# Patient Record
Sex: Male | Born: 1955 | Marital: Married | Smoking: Current every day smoker
Health system: Southern US, Community
[De-identification: ages and names within clinical notes are randomized; demographics above are authoritative.]

## PROBLEM LIST (undated history)

## (undated) DIAGNOSIS — I10 Essential (primary) hypertension: Secondary | ICD-10-CM

## (undated) DIAGNOSIS — Z720 Tobacco use: Secondary | ICD-10-CM

---

## 2017-11-18 ENCOUNTER — Inpatient Hospital Stay (HOSPITAL_COMMUNITY)
Admission: AD | Admit: 2017-11-18 | Discharge: 2017-11-29 | DRG: 234 | Disposition: A | Discharge status: Home / Self Care | Payer: Self-pay | Source: Other Acute Inpatient Hospital | Attending: Surgery | Admitting: Surgery

## 2017-11-18 ENCOUNTER — Other Ambulatory Visit: Payer: Self-pay

## 2017-11-18 ENCOUNTER — Encounter (HOSPITAL_COMMUNITY): Payer: Self-pay | Admitting: Cardiology

## 2017-11-18 DIAGNOSIS — I214 Non-ST elevation (NSTEMI) myocardial infarction: Principal | ICD-10-CM | POA: Diagnosis present

## 2017-11-18 DIAGNOSIS — Z09 Encounter for follow-up examination after completed treatment for conditions other than malignant neoplasm: Secondary | ICD-10-CM

## 2017-11-18 DIAGNOSIS — I4891 Unspecified atrial fibrillation: Secondary | ICD-10-CM | POA: Diagnosis not present

## 2017-11-18 DIAGNOSIS — E8770 Fluid overload, unspecified: Secondary | ICD-10-CM | POA: Diagnosis not present

## 2017-11-18 DIAGNOSIS — T490X5A Adverse effect of local antifungal, anti-infective and anti-inflammatory drugs, initial encounter: Secondary | ICD-10-CM | POA: Diagnosis not present

## 2017-11-18 DIAGNOSIS — R062 Wheezing: Secondary | ICD-10-CM

## 2017-11-18 DIAGNOSIS — R21 Rash and other nonspecific skin eruption: Secondary | ICD-10-CM | POA: Diagnosis not present

## 2017-11-18 DIAGNOSIS — E663 Overweight: Secondary | ICD-10-CM | POA: Diagnosis present

## 2017-11-18 DIAGNOSIS — Z888 Allergy status to other drugs, medicaments and biological substances status: Secondary | ICD-10-CM

## 2017-11-18 DIAGNOSIS — T886XXA Anaphylactic reaction due to adverse effect of correct drug or medicament properly administered, initial encounter: Secondary | ICD-10-CM | POA: Diagnosis not present

## 2017-11-18 DIAGNOSIS — Z8249 Family history of ischemic heart disease and other diseases of the circulatory system: Secondary | ICD-10-CM

## 2017-11-18 DIAGNOSIS — D62 Acute posthemorrhagic anemia: Secondary | ICD-10-CM | POA: Diagnosis not present

## 2017-11-18 DIAGNOSIS — I77810 Thoracic aortic ectasia: Secondary | ICD-10-CM | POA: Diagnosis present

## 2017-11-18 DIAGNOSIS — I10 Essential (primary) hypertension: Secondary | ICD-10-CM | POA: Diagnosis present

## 2017-11-18 DIAGNOSIS — E119 Type 2 diabetes mellitus without complications: Secondary | ICD-10-CM | POA: Diagnosis present

## 2017-11-18 DIAGNOSIS — I25110 Atherosclerotic heart disease of native coronary artery with unstable angina pectoris: Secondary | ICD-10-CM | POA: Diagnosis present

## 2017-11-18 DIAGNOSIS — Z6828 Body mass index (BMI) 28.0-28.9, adult: Secondary | ICD-10-CM

## 2017-11-18 DIAGNOSIS — Z951 Presence of aortocoronary bypass graft: Secondary | ICD-10-CM

## 2017-11-18 DIAGNOSIS — E8881 Metabolic syndrome: Secondary | ICD-10-CM | POA: Diagnosis present

## 2017-11-18 DIAGNOSIS — Y838 Other surgical procedures as the cause of abnormal reaction of the patient, or of later complication, without mention of misadventure at the time of the procedure: Secondary | ICD-10-CM | POA: Diagnosis not present

## 2017-11-18 DIAGNOSIS — Z79899 Other long term (current) drug therapy: Secondary | ICD-10-CM

## 2017-11-18 DIAGNOSIS — F17210 Nicotine dependence, cigarettes, uncomplicated: Secondary | ICD-10-CM | POA: Diagnosis present

## 2017-11-18 DIAGNOSIS — I454 Nonspecific intraventricular block: Secondary | ICD-10-CM | POA: Diagnosis present

## 2017-11-18 DIAGNOSIS — I97190 Other postprocedural cardiac functional disturbances following cardiac surgery: Secondary | ICD-10-CM | POA: Diagnosis not present

## 2017-11-18 DIAGNOSIS — I959 Hypotension, unspecified: Secondary | ICD-10-CM | POA: Diagnosis not present

## 2017-11-18 DIAGNOSIS — I2510 Atherosclerotic heart disease of native coronary artery without angina pectoris: Secondary | ICD-10-CM

## 2017-11-18 HISTORY — DX: Essential (primary) hypertension: I10

## 2017-11-18 HISTORY — DX: Tobacco use: Z72.0

## 2017-11-18 LAB — HEPARIN LEVEL (UNFRACTIONATED): Heparin Unfractionated: 0.18 IU/mL — ABNORMAL LOW (ref 0.30–0.70)

## 2017-11-18 LAB — TROPONIN I: Troponin I: 0.03 ng/mL (ref <0.03)

## 2017-11-18 LAB — HEMOGLOBIN A1C
HEMOGLOBIN A1C: 5.9 % — AB (ref 4.8–5.6)
Mean Plasma Glucose: 122.63 mg/dL

## 2017-11-18 MED ORDER — SODIUM CHLORIDE 0.9% FLUSH: 3.0000 mL | INTRAVENOUS | Status: DC | PRN

## 2017-11-18 MED ORDER — NITROGLYCERIN 0.4 MG SL SUBL
0.4000 mg | SUBLINGUAL_TABLET | SUBLINGUAL | Status: DC | PRN
  Administered 2017-11-21 – 2017-11-22 (×4): 0.4 mg via SUBLINGUAL
  Filled 2017-11-18 (×4): qty 1

## 2017-11-18 MED ORDER — ONDANSETRON HCL 4 MG/2ML IJ SOLN: 4.0000 mg | Freq: Four times a day (QID) | INTRAMUSCULAR | Status: DC | PRN

## 2017-11-18 MED ORDER — ASPIRIN 81 MG PO CHEW
81.0000 mg | CHEWABLE_TABLET | ORAL | Status: AC
  Administered 2017-11-19: 81 mg via ORAL
  Filled 2017-11-18: qty 1

## 2017-11-18 MED ORDER — ACETAMINOPHEN 325 MG PO TABS: 650.0000 mg | ORAL_TABLET | ORAL | Status: DC | PRN

## 2017-11-18 MED ORDER — SODIUM CHLORIDE 0.9% FLUSH
3.0000 mL | Freq: Two times a day (BID) | INTRAVENOUS | Status: DC
  Administered 2017-11-18 – 2017-11-19 (×2): 3 mL via INTRAVENOUS

## 2017-11-18 MED ORDER — HEPARIN BOLUS VIA INFUSION
2600.0000 [IU] | Freq: Once | INTRAVENOUS | Status: AC
  Administered 2017-11-18: 2600 [IU] via INTRAVENOUS
  Filled 2017-11-18: qty 2600

## 2017-11-18 MED ORDER — HEPARIN (PORCINE) IN NACL 100-0.45 UNIT/ML-% IJ SOLN
1350.0000 [IU]/h | INTRAMUSCULAR | Status: DC
  Administered 2017-11-18: 1350 [IU]/h via INTRAVENOUS
  Filled 2017-11-18: qty 250

## 2017-11-18 MED ORDER — ATORVASTATIN CALCIUM 80 MG PO TABS: 80.0000 mg | ORAL_TABLET | Freq: Every day | ORAL | Status: DC

## 2017-11-18 MED ORDER — ASPIRIN EC 81 MG PO TBEC
81.0000 mg | DELAYED_RELEASE_TABLET | Freq: Every day | ORAL | Status: DC
  Administered 2017-11-19: 81 mg via ORAL
  Filled 2017-11-18: qty 1

## 2017-11-18 MED ORDER — SODIUM CHLORIDE 0.9 % WEIGHT BASED INFUSION
1.0000 mL/kg/h | INTRAVENOUS | Status: DC
  Administered 2017-11-19: 1 mL/kg/h via INTRAVENOUS

## 2017-11-18 MED ORDER — SODIUM CHLORIDE 0.9 % IV SOLN: 250.0000 mL | INTRAVENOUS | Status: DC | PRN

## 2017-11-18 MED ORDER — SODIUM CHLORIDE 0.9 % WEIGHT BASED INFUSION
3.0000 mL/kg/h | INTRAVENOUS | Status: DC
  Administered 2017-11-19: 3 mL/kg/h via INTRAVENOUS

## 2017-11-18 MED ORDER — LISINOPRIL 20 MG PO TABS
20.0000 mg | ORAL_TABLET | Freq: Every day | ORAL | Status: DC
  Administered 2017-11-19 – 2017-11-22 (×4): 20 mg via ORAL
  Filled 2017-11-18 (×4): qty 1

## 2017-11-18 NOTE — Progress Notes (Addendum)
ANTICOAGULATION CONSULT NOTE - Initial Consult  Pharmacy Consult for heparin Indication: NSTEMI  Allergies  Allergen Reactions  . Chantix [Varenicline] Other (See Comments)    Nightmares    Patient Measurements: Height: 5\' 8"  (172.7 cm) Weight: 198 lb 6.6 oz (90 kg)(from OSH) IBW/kg (Calculated) : 68.4 Heparin Dosing Weight: 87 kg  Vital Signs: Temp: 98.1 F (36.7 C) (12/26 1700) Temp Source: Oral (12/26 1700) BP: 161/91 (12/26 1700) Pulse Rate: 58 (12/26 1700)  Labs: Recent Labs    11/18/17 1814  TROPONINI 0.03*    CrCl cannot be calculated (No order found.).   Medical History: Past Medical History:  Diagnosis Date  . HTN (hypertension)   . Tobacco use     Medications:  Medications Prior to Admission  Medication Sig Dispense Refill Last Dose  . Aspirin-Salicylamide-Caffeine (BC HEADACHE POWDER PO) Take 1 packet by mouth daily.   11/18/2017 at Unknown time  . lisinopril (PRINIVIL,ZESTRIL) 20 MG tablet Take 20 mg by mouth 2 (two) times daily.   11/18/2017 at Unknown time    Assessment: 61 y/o male wo no prior hx CAD [facility] work-up of NSTEMI. Plan is for cath tomorrow. He was started on IV heparin at OSH: 5000 unit bolus given at 13:33 and started on 12 units/kg/hr (1080 units/hr).   Labs from today: SCr 0.9, H/H 18.8/53.5, pltc 224  No bleeding noted.  Goal of Therapy:  Heparin level 0.3-0.7 units/ml Monitor platelets by anticoagulation protocol: Yes   Plan:  - Continue heparin drip at 1080 units/hr - Heparin level now - Daily heparin level and CBC - Monitor for s/sx of bleeding   Loura BackJennifer Sibley, PharmD, BCPS Clinical Pharmacist Phone for today [phone]- [phone] Main pharmacy - [phone] 11/18/2017 7:51 PM   Addendum: Heparin level is 0.18 and subtherapeutic. No bleeding or infusion problems per RN.  Heparin 2600 units IV bolus then increase infusion to 1350 units/hr 6 hr heparin level  Loura BackJennifer Novato, PharmD,  BCPS Clinical Pharmacist 11/18/2017 10:13 PM

## 2017-11-18 NOTE — H&P (Signed)
Cardiology Admission History and Physical:   Patient ID: Dale [name]; MRN: [id number]; DOB: 03/13/1956   Admission date: 11/18/2017  Primary Care Provider: Manley MasonVaught, Carolyn R, MD Primary CConchita Parisardiologist: New (Dr. Allyson SabalBerry)   Chief Complaint:  Chest pain   Patient Profile:   Dale [name] is a 61 y.o. male with h/o HTN and 45 year h/o tobacco use, transferred from OSH for unstable stable angina and has ruled in for NSTEMI.   History of Present Illness:   Dale [name] 61 y/o male smoker x 45 years. H/o HTN, on lisinopril 20 mg. Family h/o heart disease. Mother had PPM. He denies prior h/o HLD and DM. Married x 38 years, 1 daughter and 1 granddaughter.   He has had 1 week h/o intermitted SSCP, described as crushing pain with associated dyspnea and diaphoresis. Worse today prompting visit to PCP. [facility]/u. EKG there showed lateral ST depressions and troponin abnormal at 0.50. He was started on IV heparin + nitro patch and [facility]. He is currently CP free.    Past Medical History:  Diagnosis Date  . HTN (hypertension)   . Tobacco use     Medications Prior to Admission: Prior to Admission medications   Not on File   Lisinopril 20 mg daily   Allergies:    Allergies  Allergen Reactions  . Chantix [Varenicline] Other (See Comments)    Nightmares    Social History:   Social History   Socioeconomic History  . Marital status: Married    Spouse name: Not on file  . Number of children: Not on file  . Years of education: Not on file  . Highest education level: Not on file  Social Needs  . Financial resource strain: Not on file  . Food insecurity - worry: Not on file  . Food insecurity - inability: Not on file  . Transportation needs - medical: Not on file  . Transportation needs - non-medical: Not on file  Occupational History  . Not on file  Tobacco Use  . Smoking status: Current Every Day Smoker    Years:  45.00    Types: Cigarettes  . Smokeless tobacco: Never Used  Substance and Sexual Activity  . Alcohol use: Not on file  . Drug use: Not on file  . Sexual activity: Not on file  Other Topics Concern  . Not on file  Social History Narrative  . Not on file    Family History:   The patient's family history includes Bradycardia in his mother.    ROS:  Please see the history of present illness.  All other ROS reviewed and negative.     Physical Exam/Data:   Vitals:   11/18/17 1700  BP: (!) 161/91  Pulse: (!) 58  Temp: 98.1 F (36.7 C)  TempSrc: Oral  SpO2: 99%   No intake or output data in the 24 hours ending 11/18/17 1803 There were no vitals filed for this visit. There is no height or weight on file to calculate BMI.  General:  Well nourished, well developed, in no acute distress, obesity  HEENT: normal Lymph: no adenopathy Neck: no JVD Endocrine:  No thryomegaly Vascular: No carotid bruits; FA pulses 2+ bilaterally without bruits  Cardiac:  normal S1, S2; RRR; no murmur  Lungs:  clear to auscultation bilaterally, no wheezing, rhonchi or rales  Abd: soft, nontender, no hepatomegaly  Ext: no edema Musculoskeletal:  No deformities, BUE and BLE strength  normal and equal Skin: warm and dry  Neuro:  CNs 2-12 intact, no focal abnormalities noted Psych:  Normal affect    EKG:  The ECG was personally reviewed and demonstrates lateral ST depressions, no prior EKgs for comparision  Relevant CV Studies: Pending   Laboratory Data:  ChemistryNo results for input(s): NA, K, CL, CO2, GLUCOSE, BUN, CREATININE, CALCIUM, GFRNONAA, GFRAA, ANIONGAP in the last 168 hours.  No results for input(s): PROT, ALBUMIN, AST, ALT, ALKPHOS, BILITOT in the last 168 hours. HematologyNo results for input(s): WBC, RBC, HGB, HCT, MCV, MCH, MCHC, RDW, PLT in the last 168 hours. Cardiac EnzymesNo results for input(s): TROPONINI in the last 168 hours. No results for input(s): TROPIPOC in the last 168  hours.  BNPNo results for input(s): BNP, PROBNP in the last 168 hours.  DDimer No results for input(s): DDIMER in the last 168 hours.  Radiology/Studies:  No results found.  Assessment and Plan:   1. NSTEMI: symptoms and enzyme level is c/w NSTEMI. He is currently CP free. Will continue IV heparin and nitro patch. NPO at midnight with plans for [facility] tomorrow. Will add ASA and statin. Avoid BB for now given borderline bradycardia. 2D echo to assess LVEF. Fasting lipid panel in the am. Smoking cessation strongly advised.    Severity of Illness: The appropriate patient status for this patient is INPATIENT. Inpatient status is judged to be reasonable and necessary in order to provide the required intensity of service to ensure the patient's safety. The patient's presenting symptoms, physical exam findings, and initial radiographic and laboratory data in the context of their chronic comorbidities is felt to place them at high risk for further clinical deterioration. Furthermore, it is not anticipated that the patient will be medically stable for discharge from the hospital within 2 midnights of admission. The following factors support the patient status of inpatient.   " The patient's presenting symptoms include unstable angina. " The worrisome physical exam findings include abnormal EKG and HTN. " The initial radiographic and laboratory data are worrisome because of abnormal EKG and + troponin. " The chronic co-morbidities include HTN and tobacco use.   * I certify that at the point of admission it is my clinical judgment that the patient will require inpatient hospital care spanning beyond 2 midnights from the point of admission due to high intensity of service, high risk for further deterioration and high frequency of surveillance required.*    For questions or updates, please contact [facility] Please consult [email or web address] for contact info under Cardiology/STEMI.    Signed, Robbie Lis, PA-C  11/18/2017 6:03 PM   Agree with note written by Boyce Medici  [facility]  Dale [name] was transferred from [facility] for unstable angina/non-STEMI.  He has no prior cardiac history.  He is a 61 year old moderately overweight married Caucasian male with a history of hypertension and 45 pack years of tobacco abuse currently smoking 1 pack/day.  There is no family history of heart disease.  He is never had a heart attack or stroke.  He works as a Location manager.  He has had chest pain off and on for the last 2 weeks worse last night and today.  He saw his primary care physician, Lonie Peak PA-C recommended that he go to [facility].  He was treated with IV heparin.  His enzymes were low and his EKG showed nonspecific changes with some flattening lateral ST segment depression.  He was treated with topical nitrates and transported here where  he is currently pain-free.  His exam is benign.  We will continue his IV heparin, cycle his enzymes and place him on aspirin.  His heart rate is already in the 60s and therefore a beta-blocker will not be started tonight.  We will plan on performing diagnostic coronary angiography tomorrow. The patient understands that risks included but are not limited to stroke (1 in 1000), death (1 in 1000), kidney failure [usually temporary] (1 in 500), bleeding (1 in 200), allergic reaction [possibly serious] (1 in 200). The patient understands and agrees to proceed   Nanetta BattyJonathan Pansey Pinheiro 11/18/2017 6:05 PM

## 2017-11-19 ENCOUNTER — Inpatient Hospital Stay (HOSPITAL_COMMUNITY): Payer: Self-pay

## 2017-11-19 ENCOUNTER — Encounter (HOSPITAL_COMMUNITY)
Admission: AD | Disposition: A | Discharge status: Home / Self Care | Payer: Self-pay | Source: Other Acute Inpatient Hospital | Attending: Surgery

## 2017-11-19 ENCOUNTER — Other Ambulatory Visit: Payer: Self-pay

## 2017-11-19 DIAGNOSIS — I200 Unstable angina: Secondary | ICD-10-CM

## 2017-11-19 DIAGNOSIS — I214 Non-ST elevation (NSTEMI) myocardial infarction: Secondary | ICD-10-CM

## 2017-11-19 DIAGNOSIS — R079 Chest pain, unspecified: Secondary | ICD-10-CM

## 2017-11-19 HISTORY — PX: LEFT HEART CATH AND CORONARY ANGIOGRAPHY: CATH118249

## 2017-11-19 LAB — TROPONIN I: Troponin I: <0.03 ng/mL (ref <0.03)

## 2017-11-19 LAB — CBC
HCT: 47.9 % (ref 39.0–52.0)
Hemoglobin: 16.4 g/dL (ref 13.0–17.0)
MCH: 32.8 pg (ref 26.0–34.0)
MCHC: 34.2 g/dL (ref 30.0–36.0)
MCV: 95.8 fL (ref 78.0–100.0)
PLATELETS: 196 10*3/uL (ref 150–400)
RBC: 5 MIL/uL (ref 4.22–5.81)
RDW: 13.1 % (ref 11.5–15.5)
WBC: 6.5 10*3/uL (ref 4.0–10.5)

## 2017-11-19 LAB — PROTIME-INR
INR: 1.01
PROTHROMBIN TIME: 13.2 s (ref 11.4–15.2)

## 2017-11-19 LAB — LIPID PANEL
CHOL/HDL RATIO: 3.4 ratio
CHOLESTEROL: 179 mg/dL (ref 0–200)
HDL: 52 mg/dL (ref >40)
LDL Cholesterol: 104 mg/dL — ABNORMAL HIGH (ref 0–99)
Triglycerides: 117 mg/dL (ref <150)
VLDL: 23 mg/dL (ref 0–40)

## 2017-11-19 LAB — ECHOCARDIOGRAM COMPLETE
Height: 68 in
Weight: 3174.62 oz

## 2017-11-19 LAB — BASIC METABOLIC PANEL
ANION GAP: 8 (ref 5–15)
BUN: 12 mg/dL (ref 6–20)
CHLORIDE: 102 mmol/L (ref 101–111)
CO2: 23 mmol/L (ref 22–32)
Calcium: 8.4 mg/dL — ABNORMAL LOW (ref 8.9–10.3)
Creatinine, Ser: 0.89 mg/dL (ref 0.61–1.24)
Glucose, Bld: 92 mg/dL (ref 65–99)
POTASSIUM: 3.8 mmol/L (ref 3.5–5.1)
Sodium: 133 mmol/L — ABNORMAL LOW (ref 135–145)

## 2017-11-19 LAB — HEPARIN LEVEL (UNFRACTIONATED): HEPARIN UNFRACTIONATED: 0.55 [IU]/mL (ref 0.30–0.70)

## 2017-11-19 LAB — HIV ANTIBODY (ROUTINE TESTING W REFLEX): HIV SCREEN 4TH GENERATION: NONREACTIVE

## 2017-11-19 SURGERY — LEFT HEART CATH AND CORONARY ANGIOGRAPHY
Anesthesia: LOCAL

## 2017-11-19 MED ORDER — ATORVASTATIN CALCIUM 80 MG PO TABS
80.0000 mg | ORAL_TABLET | Freq: Every day | ORAL | Status: DC
  Administered 2017-11-19 – 2017-11-28 (×9): 80 mg via ORAL
  Filled 2017-11-19 (×9): qty 1

## 2017-11-19 MED ORDER — HEPARIN (PORCINE) IN NACL 2-0.9 UNIT/ML-% IJ SOLN
INTRAMUSCULAR | Status: AC
  Filled 2017-11-19: qty 1000

## 2017-11-19 MED ORDER — LIDOCAINE HCL (PF) 1 % IJ SOLN
INTRAMUSCULAR | Status: DC | PRN
  Administered 2017-11-19: 2 mL

## 2017-11-19 MED ORDER — SODIUM CHLORIDE 0.9% FLUSH
3.0000 mL | Freq: Two times a day (BID) | INTRAVENOUS | Status: DC
  Administered 2017-11-20 – 2017-11-23 (×4): 3 mL via INTRAVENOUS

## 2017-11-19 MED ORDER — HEPARIN (PORCINE) IN NACL 2-0.9 UNIT/ML-% IJ SOLN
INTRAMUSCULAR | Status: AC | PRN
  Administered 2017-11-19: 1000 mL

## 2017-11-19 MED ORDER — NITROGLYCERIN 1 MG/10 ML FOR IR/CATH LAB
INTRA_ARTERIAL | Status: AC
  Filled 2017-11-19: qty 10

## 2017-11-19 MED ORDER — ASPIRIN 81 MG PO CHEW
81.0000 mg | CHEWABLE_TABLET | Freq: Every day | ORAL | Status: DC
  Administered 2017-11-20 – 2017-11-22 (×3): 81 mg via ORAL
  Filled 2017-11-19 (×3): qty 1

## 2017-11-19 MED ORDER — FENTANYL CITRATE (PF) 100 MCG/2ML IJ SOLN
INTRAMUSCULAR | Status: DC | PRN
  Administered 2017-11-19: 25 ug via INTRAVENOUS

## 2017-11-19 MED ORDER — ONDANSETRON HCL 4 MG/2ML IJ SOLN: 4.0000 mg | Freq: Four times a day (QID) | INTRAMUSCULAR | Status: DC | PRN

## 2017-11-19 MED ORDER — HEPARIN SODIUM (PORCINE) 1000 UNIT/ML IJ SOLN
INTRAMUSCULAR | Status: DC | PRN
  Administered 2017-11-19: 4500 [IU] via INTRAVENOUS

## 2017-11-19 MED ORDER — IOPAMIDOL (ISOVUE-370) INJECTION 76%
INTRAVENOUS | Status: DC | PRN
  Administered 2017-11-19: 85 mL via INTRA_ARTERIAL

## 2017-11-19 MED ORDER — HYDRALAZINE HCL 20 MG/ML IJ SOLN
10.0000 mg | Freq: Three times a day (TID) | INTRAMUSCULAR | Status: DC | PRN
  Administered 2017-11-19 – 2017-11-20 (×2): 10 mg via INTRAVENOUS
  Filled 2017-11-19 (×2): qty 1

## 2017-11-19 MED ORDER — NICOTINE 21 MG/24HR TD PT24
21.0000 mg | MEDICATED_PATCH | Freq: Every day | TRANSDERMAL | Status: DC
  Administered 2017-11-19 – 2017-11-22 (×4): 21 mg via TRANSDERMAL
  Filled 2017-11-19 (×4): qty 1

## 2017-11-19 MED ORDER — VERAPAMIL HCL 2.5 MG/ML IV SOLN
INTRAVENOUS | Status: AC
  Filled 2017-11-19: qty 2

## 2017-11-19 MED ORDER — SODIUM CHLORIDE 0.9 % IV SOLN: 250.0000 mL | INTRAVENOUS | Status: DC | PRN

## 2017-11-19 MED ORDER — HEPARIN (PORCINE) IN NACL 100-0.45 UNIT/ML-% IJ SOLN
1450.0000 [IU]/h | INTRAMUSCULAR | Status: DC
  Administered 2017-11-20 – 2017-11-22 (×5): 1450 [IU]/h via INTRAVENOUS
  Filled 2017-11-19 (×6): qty 250

## 2017-11-19 MED ORDER — AMLODIPINE BESYLATE 5 MG PO TABS: 5.0000 mg | ORAL_TABLET | Freq: Every day | ORAL | Status: DC

## 2017-11-19 MED ORDER — SODIUM CHLORIDE 0.9% FLUSH: 3.0000 mL | INTRAVENOUS | Status: DC | PRN

## 2017-11-19 MED ORDER — MIDAZOLAM HCL 2 MG/2ML IJ SOLN
INTRAMUSCULAR | Status: DC | PRN
  Administered 2017-11-19: 1 mg via INTRAVENOUS

## 2017-11-19 MED ORDER — HEPARIN (PORCINE) IN NACL 100-0.45 UNIT/ML-% IJ SOLN
12.00 | INTRAMUSCULAR | Status: DC
Start: ? — End: 2017-11-19

## 2017-11-19 MED ORDER — FENTANYL CITRATE (PF) 100 MCG/2ML IJ SOLN
INTRAMUSCULAR | Status: AC
  Filled 2017-11-19: qty 2

## 2017-11-19 MED ORDER — MIDAZOLAM HCL 2 MG/2ML IJ SOLN
INTRAMUSCULAR | Status: AC
  Filled 2017-11-19: qty 2

## 2017-11-19 MED ORDER — HEPARIN SODIUM (PORCINE) 1000 UNIT/ML IJ SOLN
2000.00 | INTRAMUSCULAR | Status: DC
Start: ? — End: 2017-11-19

## 2017-11-19 MED ORDER — AMLODIPINE BESYLATE 5 MG PO TABS
5.0000 mg | ORAL_TABLET | Freq: Every day | ORAL | Status: DC
  Administered 2017-11-19 – 2017-11-22 (×4): 5 mg via ORAL
  Filled 2017-11-19 (×4): qty 1

## 2017-11-19 MED ORDER — SODIUM CHLORIDE 0.9 % IV SOLN: INTRAVENOUS | Status: AC

## 2017-11-19 MED ORDER — MORPHINE SULFATE (PF) 2 MG/ML IV SOLN: 2.0000 mg | INTRAVENOUS | Status: DC | PRN

## 2017-11-19 MED ORDER — ACETAMINOPHEN 325 MG PO TABS
650.0000 mg | ORAL_TABLET | ORAL | Status: DC | PRN
  Administered 2017-11-22: 650 mg via ORAL
  Filled 2017-11-19: qty 2

## 2017-11-19 MED ORDER — VERAPAMIL HCL 2.5 MG/ML IV SOLN
INTRA_ARTERIAL | Status: DC | PRN
  Administered 2017-11-19: 16:00:00 via INTRA_ARTERIAL

## 2017-11-19 MED ORDER — HEPARIN SODIUM (PORCINE) 1000 UNIT/ML IJ SOLN
INTRAMUSCULAR | Status: AC
  Filled 2017-11-19: qty 1

## 2017-11-19 MED ORDER — LIDOCAINE HCL (PF) 1 % IJ SOLN
INTRAMUSCULAR | Status: AC
  Filled 2017-11-19: qty 30

## 2017-11-19 MED ORDER — ZOLPIDEM TARTRATE 5 MG PO TABS
5.0000 mg | ORAL_TABLET | Freq: Once | ORAL | Status: AC
  Administered 2017-11-19: 5 mg via ORAL
  Filled 2017-11-19: qty 1

## 2017-11-19 MED ORDER — HEPARIN (PORCINE) IN NACL 2-0.9 UNIT/ML-% IJ SOLN: INTRAMUSCULAR | Status: DC | PRN

## 2017-11-19 SURGICAL SUPPLY — 12 items

## 2017-11-19 NOTE — Progress Notes (Signed)
ANTICOAGULATION CONSULT NOTE - Follow Up Consult  Pharmacy Consult for heparin Indication: NSTEMI  Allergies  Allergen Reactions  . Chantix [Varenicline] Other (See Comments)    Nightmares    Patient Measurements: Height: 5\' 8"  (172.7 cm) Weight: 198 lb 6.6 oz (90 kg)(from OSH) IBW/kg (Calculated) : 68.4 Heparin Dosing Weight: 87 kg  Vital Signs: Temp: 97.3 F (36.3 C) (12/27 1342) Temp Source: Oral (12/27 1342) BP: 173/111 (12/27 1636) Pulse Rate: 64 (12/27 1636)  Labs: Recent Labs    11/18/17 1814 11/18/17 2039 11/19/17 0035 11/19/17 0452  HGB  --   --   --  16.4  HCT  --   --   --  47.9  PLT  --   --   --  196  LABPROT  --   --   --  13.2  INR  --   --   --  1.01  HEPARINUNFRC  --  0.18*  --  0.55  CREATININE  --   --   --  0.89  TROPONINI 0.03*  --  <0.03 <0.03    Estimated Creatinine Clearance: 96.1 mL/min (by C-G formula based on SCr of 0.89 mg/dL).  Assessment: 61 y/o male [facility], s/p cardiac cath  Sheath removal 1645 - to resume hep w/o bolus 2 hrs after  Goal of Therapy:  Heparin level 0.3-0.7 units/ml Monitor platelets by anticoagulation protocol: Yes   Plan:  Resume heparin 1350 units/hr @ 1845 0100 HL  Isaac BlissMichael April Carlyon, PharmD, BCPS, BCCCP Clinical Pharmacist Clinical phone for 11/19/2017 from 1430 [phone]: [phone] If after 2300, please call main pharmacy at: [phone] 11/19/2017 4:50 PM

## 2017-11-19 NOTE — Progress Notes (Signed)
Paged Cardiology about elevated blood pressures post cath. New orders placed and patient received.   Sheppard Evensina Takyra Cantrall RN

## 2017-11-19 NOTE — H&P (View-Only) (Signed)
Progress Note  Patient Name: Dale ParisBilly Kooyman Date of Encounter: 11/19/2017  Primary Cardiologist: Allyson SabalBerry   Subjective   No chest pain.   Inpatient Medications    Scheduled Meds: . aspirin EC  81 mg Oral Daily  . atorvastatin  80 mg Oral q1800  . lisinopril  20 mg Oral Daily  . sodium chloride flush  3 mL Intravenous Q12H   Continuous Infusions: . sodium chloride    . sodium chloride 1 mL/kg/hr (11/19/17 0754)  . heparin 1,350 Units/hr (11/18/17 2242)   PRN Meds: sodium chloride, acetaminophen, nitroGLYCERIN, ondansetron (ZOFRAN) IV, sodium chloride flush   Vital Signs    Vitals:   11/18/17 1700 11/18/17 1900 11/18/17 2300  BP: (!) 161/91  (!) 144/93  Pulse: (!) 58  61  Temp: 98.1 F (36.7 C)  (!) 97.5 F (36.4 C)  TempSrc: Oral  Oral  SpO2: 99%  100%  Weight:  198 lb 6.6 oz (90 kg)   Height:  5\' 8"  (1.727 m)     Intake/Output Summary (Last 24 hours) at 11/19/2017 16100821 Last data filed at 11/19/2017 0754 Gross per 24 hour  Intake 314.55 ml  Output 520 ml  Net -205.45 ml   Filed Weights   11/18/17 1900  Weight: 198 lb 6.6 oz (90 kg)    Telemetry    SB - Personally Reviewed  ECG    SR with LVH - Personally Reviewed  Physical Exam   General: Well developed, well nourished, male appearing in no acute distress. Head: Normocephalic, atraumatic.  Neck: Supple without bruits, JVD. Lungs:  Resp regular and unlabored, CTA. Heart: RRR, S1, S2, no S3, S4, or murmur; no rub. Abdomen: Soft, non-tender, non-distended with normoactive bowel sounds.  Extremities: No clubbing, cyanosis, edema. Distal pedal pulses are 2+ bilaterally. Neuro: Alert and oriented X 3. Moves all extremities spontaneously. Psych: Normal affect.  Labs    Chemistry Recent Labs  Lab 11/19/17 0452  NA 133*  K 3.8  CL 102  CO2 23  GLUCOSE 92  BUN 12  CREATININE 0.89  CALCIUM 8.4*  GFRNONAA >60  GFRAA >60  ANIONGAP 8     Hematology Recent Labs  Lab 11/19/17 0452  WBC  6.5  RBC 5.00  HGB 16.4  HCT 47.9  MCV 95.8  MCH 32.8  MCHC 34.2  RDW 13.1  PLT 196    Cardiac Enzymes Recent Labs  Lab 11/18/17 1814 11/19/17 0035 11/19/17 0452  TROPONINI 0.03* <0.03 <0.03   No results for input(s): TROPIPOC in the last 168 hours.   BNPNo results for input(s): BNP, PROBNP in the last 168 hours.   DDimer No results for input(s): DDIMER in the last 168 hours.    Radiology    No results found.  Cardiac Studies   N/a   Patient Profile     61 y.o. male with h/o HTN and 45 year h/o tobacco use, transferred from OSH for unstable stable angina and has ruled in for NSTEMI.   Assessment & Plan    1. NSTEMI: Trop peaked at 0.50. No chest pain overnight. Remains on IV heparin. Planned for cardiac cath this morning. LDL 104. -- on ASA, statin -- echo pending  2. Tobacco use: cessation advised.   3. HTN: blood pressures are borderline, may need further adjustment post cath.   Signed, Laverda PageLindsay Roberts, NP  11/19/2017, 8:21 AM  Pager # [phone]   For questions or updates, please contact [facility] Please consult [email or web address] for contact info under  Cardiology/STEMI.   Agree with note by Laverda PageLindsay Roberts NP-C  No recurrent chest pain.  Troponins have remained low.  He is on IV heparin and topical nitrates.  His exam is benign.  He is scheduled for cardiac catheterization afternoon.  He has eaten a clear liquid breakfast.  Runell GessJonathan J. Lilliemae Fruge, M.D., FACP, [facility] Center[facility]ACC, Kathryne ErikssonFAHA, FSCAI [facility] [address] [place]  [address]  [phone] 11/19/2017 9:17 AM

## 2017-11-19 NOTE — Interval H&P Note (Signed)
Cath Lab Visit (complete for each Cath Lab visit)  Clinical Evaluation Leading to the Procedure:   ACS: Yes.    Non-ACS:    Anginal Classification: CCS III  Anti-ischemic medical therapy: No Therapy  Non-Invasive Test Results: No non-invasive testing performed  Prior CABG: No previous CABG      History and Physical Interval Note:  11/19/2017 3:56 PM  Dale [name]  has presented today for surgery, with the diagnosis of Nstemi  The various methods of treatment have been discussed with the patient and family. After consideration of risks, benefits and other options for treatment, the patient has consented to  Procedure(s): LEFT HEART CATH AND CORONARY ANGIOGRAPHY (N/A) as a surgical intervention .  The patient's history has been reviewed, patient examined, no change in status, stable for surgery.  I have reviewed the patient's chart and labs.  Questions were answered to the patient's satisfaction.     Nanetta BattyJonathan Jessee Mezera

## 2017-11-19 NOTE — Progress Notes (Signed)
 Progress Note  Patient Name: Dale [name] Date of Encounter: 11/19/2017  Primary Cardiologist: Diante Barley   Subjective   No chest pain.   Inpatient Medications    Scheduled Meds: . aspirin EC  81 mg Oral Daily  . atorvastatin  80 mg Oral q1800  . lisinopril  20 mg Oral Daily  . sodium chloride flush  3 mL Intravenous Q12H   Continuous Infusions: . sodium chloride    . sodium chloride 1 mL/kg/hr (11/19/17 0754)  . heparin 1,350 Units/hr (11/18/17 2242)   PRN Meds: sodium chloride, acetaminophen, nitroGLYCERIN, ondansetron (ZOFRAN) IV, sodium chloride flush   Vital Signs    Vitals:   11/18/17 1700 11/18/17 1900 11/18/17 2300  BP: (!) 161/91  (!) 144/93  Pulse: (!) 58  61  Temp: 98.1 F (36.7 C)  (!) 97.5 F (36.4 C)  TempSrc: Oral  Oral  SpO2: 99%  100%  Weight:  198 lb 6.6 oz (90 kg)   Height:  5' 8" (1.727 m)     Intake/Output Summary (Last 24 hours) at 11/19/2017 0821 Last data filed at 11/19/2017 0754 Gross per 24 hour  Intake 314.55 ml  Output 520 ml  Net -205.45 ml   Filed Weights   11/18/17 1900  Weight: 198 lb 6.6 oz (90 kg)    Telemetry    SB - Personally Reviewed  ECG    SR with LVH - Personally Reviewed  Physical Exam   General: Well developed, well nourished, male appearing in no acute distress. Head: Normocephalic, atraumatic.  Neck: Supple without bruits, JVD. Lungs:  Resp regular and unlabored, CTA. Heart: RRR, S1, S2, no S3, S4, or murmur; no rub. Abdomen: Soft, non-tender, non-distended with normoactive bowel sounds.  Extremities: No clubbing, cyanosis, edema. Distal pedal pulses are 2+ bilaterally. Neuro: Alert and oriented X 3. Moves all extremities spontaneously. Psych: Normal affect.  Labs    Chemistry Recent Labs  Lab 11/19/17 0452  NA 133*  K 3.8  CL 102  CO2 23  GLUCOSE 92  BUN 12  CREATININE 0.89  CALCIUM 8.4*  GFRNONAA >60  GFRAA >60  ANIONGAP 8     Hematology Recent Labs  Lab 11/19/17 0452  WBC  6.5  RBC 5.00  HGB 16.4  HCT 47.9  MCV 95.8  MCH 32.8  MCHC 34.2  RDW 13.1  PLT 196    Cardiac Enzymes Recent Labs  Lab 11/18/17 1814 11/19/17 0035 11/19/17 0452  TROPONINI 0.03* <0.03 <0.03   No results for input(s): TROPIPOC in the last 168 hours.   BNPNo results for input(s): BNP, PROBNP in the last 168 hours.   DDimer No results for input(s): DDIMER in the last 168 hours.    Radiology    No results found.  Cardiac Studies   N/a   Patient Profile     61 y.o. male with h/o HTN and 45 year h/o tobacco use, transferred from OSH for unstable stable angina and has ruled in for NSTEMI.   Assessment & Plan    1. NSTEMI: Trop peaked at 0.50. No chest pain overnight. Remains on IV heparin. Planned for cardiac cath this morning. LDL 104. -- on ASA, statin -- echo pending  2. Tobacco use: cessation advised.   3. HTN: blood pressures are borderline, may need further adjustment post cath.   Signed, Lindsay Roberts, NP  11/19/2017, 8:21 AM  Pager # [phone]   For questions or updates, please contact [facility] Please consult [email or web address] for contact info under   Cardiology/STEMI.   Agree with note by Laverda PageLindsay Roberts NP-C  No recurrent chest pain.  Troponins have remained low.  He is on IV heparin and topical nitrates.  His exam is benign.  He is scheduled for cardiac catheterization afternoon.  He has eaten a clear liquid breakfast.  Runell GessJonathan J. Korver Graybeal, M.D., FACP, [facility] CenterFACC, Kathryne ErikssonFAHA, FSCAI [facility] [address] [place], [place]  [address]  [phone] 11/19/2017 9:17 AM

## 2017-11-19 NOTE — Progress Notes (Signed)
ANTICOAGULATION CONSULT NOTE - Follow Up Consult  Pharmacy Consult for heparin Indication: NSTEMI  Allergies  Allergen Reactions  . Chantix [Varenicline] Other (See Comments)    Nightmares    Patient Measurements: Height: 5\' 8"  (172.7 cm) Weight: 198 lb 6.6 oz (90 kg)(from OSH) IBW/kg (Calculated) : 68.4 Heparin Dosing Weight: 87 kg  Vital Signs: Temp: 97.5 F (36.4 C) (12/26 2300) Temp Source: Oral (12/26 2300) BP: 155/99 (12/27 0819) Pulse Rate: 61 (12/26 2300)  Labs: Recent Labs    11/18/17 1814 11/18/17 2039 11/19/17 0035 11/19/17 0452  HGB  --   --   --  16.4  HCT  --   --   --  47.9  PLT  --   --   --  196  LABPROT  --   --   --  13.2  INR  --   --   --  1.01  HEPARINUNFRC  --  0.18*  --  0.55  CREATININE  --   --   --  0.89  TROPONINI 0.03*  --  <0.03 <0.03    Estimated Creatinine Clearance: 96.1 mL/min (by C-G formula based on SCr of 0.89 mg/dL).   Medications:  Scheduled:  . aspirin EC  81 mg Oral Daily  . atorvastatin  80 mg Oral q1800  . lisinopril  20 mg Oral Daily  . sodium chloride flush  3 mL Intravenous Q12H   Infusions:  . sodium chloride    . sodium chloride 1 mL/kg/hr (11/19/17 0754)  . heparin 1,350 Units/hr (11/18/17 2242)    Assessment: 61 [facility], on IV heparin planning cardiac cath today. Heparin level this morning is therapeutic at 0.55 s/p bolus and rate increase overnight. CBC is within normal range, no bleeding noted.  Goal of Therapy:  Heparin level 0.3-0.7 units/ml Monitor platelets by anticoagulation protocol: Yes   Plan:  Continue heparin 1350 units/hr Daily heparin level and CBC while on heparin F/u cardiac cath results and cardiology plans   Al CorpusLindsey Jahmeir Geisen, PharmD PGY1 Pharmacy Resident Phone: [phone] After 4:30PM please call Main Pharmacy [phone] 11/19/2017,9:57 AM

## 2017-11-20 ENCOUNTER — Encounter (HOSPITAL_COMMUNITY): Payer: Self-pay | Admitting: Cardiovascular Disease

## 2017-11-20 ENCOUNTER — Other Ambulatory Visit: Payer: Self-pay | Admitting: *Deleted

## 2017-11-20 ENCOUNTER — Inpatient Hospital Stay (HOSPITAL_COMMUNITY): Payer: Self-pay

## 2017-11-20 DIAGNOSIS — I2510 Atherosclerotic heart disease of native coronary artery without angina pectoris: Secondary | ICD-10-CM

## 2017-11-20 DIAGNOSIS — I25110 Atherosclerotic heart disease of native coronary artery with unstable angina pectoris: Secondary | ICD-10-CM

## 2017-11-20 DIAGNOSIS — I214 Non-ST elevation (NSTEMI) myocardial infarction: Secondary | ICD-10-CM

## 2017-11-20 DIAGNOSIS — Z01810 Encounter for preprocedural cardiovascular examination: Secondary | ICD-10-CM

## 2017-11-20 DIAGNOSIS — E785 Hyperlipidemia, unspecified: Secondary | ICD-10-CM

## 2017-11-20 LAB — PULMONARY FUNCTION TEST
DL/VA % PRED: 108 %
DL/VA: 4.79 ml/min/mmHg/L
DLCO COR % PRED: 74 %
DLCO COR: 21.06 ml/min/mmHg
DLCO UNC % PRED: 78 %
DLCO unc: 22.21 ml/min/mmHg
FEF 25-75 POST: 1.05 L/s
FEF 25-75 PRE: 0.7 L/s
FEF2575-%CHANGE-POST: 49 %
FEF2575-%PRED-POST: 39 %
FEF2575-%PRED-PRE: 26 %
FEV1-%Change-Post: 18 %
FEV1-%PRED-PRE: 47 %
FEV1-%Pred-Post: 56 %
FEV1-Post: 1.83 L
FEV1-Pre: 1.54 L
FEV1FVC-%CHANGE-POST: 0 %
FEV1FVC-%Pred-Pre: 70 %
FEV6-%CHANGE-POST: 12 %
FEV6-%Pred-Post: 76 %
FEV6-%Pred-Pre: 67 %
FEV6-Post: 3.09 L
FEV6-Pre: 2.75 L
FEV6FVC-%Change-Post: -6 %
FEV6FVC-%Pred-Post: 94 %
FEV6FVC-%Pred-Pre: 101 %
FVC-%Change-Post: 18 %
FVC-%PRED-PRE: 68 %
FVC-%Pred-Post: 80 %
FVC-PRE: 2.9 L
FVC-Post: 3.45 L
POST FEV1/FVC RATIO: 53 %
PRE FEV1/FVC RATIO: 53 %
Post FEV6/FVC ratio: 90 %
Pre FEV6/FVC Ratio: 96 %
RV % pred: 187 %
RV: 3.93 L
TLC % pred: 108 %
TLC: 6.92 L

## 2017-11-20 LAB — BASIC METABOLIC PANEL
Anion gap: 8 (ref 5–15)
BUN: 11 mg/dL (ref 6–20)
CO2: 23 mmol/L (ref 22–32)
CREATININE: 0.91 mg/dL (ref 0.61–1.24)
Calcium: 8.5 mg/dL — ABNORMAL LOW (ref 8.9–10.3)
Chloride: 101 mmol/L (ref 101–111)
Glucose, Bld: 97 mg/dL (ref 65–99)
POTASSIUM: 3.7 mmol/L (ref 3.5–5.1)
SODIUM: 132 mmol/L — AB (ref 135–145)

## 2017-11-20 LAB — CBC
HCT: 48.3 % (ref 39.0–52.0)
Hemoglobin: 16.7 g/dL (ref 13.0–17.0)
MCH: 33.3 pg (ref 26.0–34.0)
MCHC: 34.6 g/dL (ref 30.0–36.0)
MCV: 96.4 fL (ref 78.0–100.0)
PLATELETS: 192 10*3/uL (ref 150–400)
RBC: 5.01 MIL/uL (ref 4.22–5.81)
RDW: 13.2 % (ref 11.5–15.5)
WBC: 7.1 10*3/uL (ref 4.0–10.5)

## 2017-11-20 LAB — HEPARIN LEVEL (UNFRACTIONATED)
HEPARIN UNFRACTIONATED: 0.35 [IU]/mL (ref 0.30–0.70)
HEPARIN UNFRACTIONATED: 0.28 [IU]/mL — AB (ref 0.30–0.70)

## 2017-11-20 MED ORDER — METOPROLOL TARTRATE 12.5 MG HALF TABLET
12.5000 mg | ORAL_TABLET | Freq: Two times a day (BID) | ORAL | Status: DC
  Administered 2017-11-20 – 2017-11-22 (×6): 12.5 mg via ORAL
  Filled 2017-11-20 (×6): qty 1

## 2017-11-20 MED ORDER — ZOLPIDEM TARTRATE 5 MG PO TABS
5.0000 mg | ORAL_TABLET | Freq: Once | ORAL | Status: AC
  Administered 2017-11-20: 5 mg via ORAL
  Filled 2017-11-20: qty 1

## 2017-11-20 MED ORDER — ALBUTEROL SULFATE (2.5 MG/3ML) 0.083% IN NEBU
2.5000 mg | INHALATION_SOLUTION | Freq: Once | RESPIRATORY_TRACT | Status: AC
  Administered 2017-11-20: 2.5 mg via RESPIRATORY_TRACT

## 2017-11-20 NOTE — Progress Notes (Signed)
ANTICOAGULATION CONSULT NOTE - Follow Up Consult  Pharmacy Consult for heparin Indication: NSTEMI  Allergies  Allergen Reactions  . Chantix [Varenicline] Other (See Comments)    Nightmares    Patient Measurements: Height: 5\' 8"  (172.7 cm) Weight: 198 lb 6.6 oz (90 kg)(from OSH) IBW/kg (Calculated) : 68.4 Heparin Dosing Weight: 87 kg  Vital Signs: Temp: 98.6 F (37 C) (12/27 2017) Temp Source: Oral (12/27 2017) BP: 162/95 (12/27 2017) Pulse Rate: 64 (12/27 1636)  Labs: Recent Labs    11/18/17 1814 11/18/17 2039 11/19/17 0035 11/19/17 0452 11/20/17 0058  HGB  --   --   --  16.4 16.7  HCT  --   --   --  47.9 48.3  PLT  --   --   --  196 192  LABPROT  --   --   --  13.2  --   INR  --   --   --  1.01  --   HEPARINUNFRC  --  0.18*  --  0.55 0.28*  CREATININE  --   --   --  0.89 0.91  TROPONINI 0.03*  --  <0.03 <0.03  --     Estimated Creatinine Clearance: 94 mL/min (by C-G formula based on SCr of 0.91 mg/dL).  Assessment: 61 [facility], s/p cardiac cath.  Sheath removal 1645 and resumed heparin w/o bolus 2 hrs after.   Heparin level is slightly subtherapeutic at 0.28 (drawn about an hour early) and no infusion issues per RN. CBC stable and no s/s bleeding reported.   Goal of Therapy:  Heparin level 0.3-0.7 units/ml Monitor platelets by anticoagulation protocol: Yes   Plan:  Increase heparin gtt to 1450 units/hr  Heparin level in 6 hrs Daily heparin level and CBC Monitor for s/s bleeding  Einar CrowKatherine Weigle, PharmD Clinical Pharmacist 11/20/17 2:17 AM

## 2017-11-20 NOTE — Care Management Note (Signed)
Case Management Note  Patient Details  Name: Conchita ParisBilly Traweek MRN: [id number] Date of Birth: [birth year]  Subjective/Objective:                 Patient from home w spouse, awaiting CT sx consult for CABG. CM will continue to follow.    Action/Plan:   Expected Discharge Date:  11/20/17               Expected Discharge Plan:  Home w Home Health Services  In-House Referral:     Discharge planning Services  CM Consult  Post Acute Care Choice:    Choice offered to:     DME Arranged:    DME Agency:     HH Arranged:    HH Agency:     Status of Service:  In process, will continue to follow  If discussed at Long Length of Stay Meetings, dates discussed:    Additional Comments:  Lawerance SabalDebbie Talise Sligh, RN 11/20/2017, 3:06 PM

## 2017-11-20 NOTE — Progress Notes (Signed)
ANTICOAGULATION CONSULT NOTE - Follow Up Consult  Pharmacy Consult for Heparin Indication: NSTEMI  Allergies  Allergen Reactions  . Chantix [Varenicline] Other (See Comments)    Nightmares    Patient Measurements: Height: 5\' 8"  (172.7 cm) Weight: 191 lb 1.6 oz (86.7 kg) IBW/kg (Calculated) : 68.4 Heparin Dosing Weight: 87 kg  Vital Signs: Temp: 98.2 F (36.8 C) (12/28 0858) Temp Source: Oral (12/28 0858) BP: 148/75 (12/28 0858) Pulse Rate: 73 (12/28 0645)  Labs: Recent Labs    11/18/17 1814  11/19/17 0035 11/19/17 0452 11/20/17 0058 11/20/17 0819  HGB  --   --   --  16.4 16.7  --   HCT  --   --   --  47.9 48.3  --   PLT  --   --   --  196 192  --   LABPROT  --   --   --  13.2  --   --   INR  --   --   --  1.01  --   --   HEPARINUNFRC  --    < >  --  0.55 0.28* 0.35  CREATININE  --   --   --  0.89 0.91  --   TROPONINI 0.03*  --  <0.03 <0.03  --   --    < > = values in this interval not displayed.    Estimated Creatinine Clearance: 92.4 mL/min (by C-G formula based on SCr of 0.91 mg/dL).  Assessment: 61 y/o male [facility] for NSTEMI workup, s/p cardiac cath 12/27 noted with ostial LAD lesion.  -Heparin level now therapeutic at 0.35 -CBC stable and no s/sx bleeding reported  Goal of Therapy:  Heparin level 0.3-0.7 units/ml Monitor platelets by anticoagulation protocol: Yes   Plan:  Continue Heparin at 1,450 units/hr  Daily heparin level and CBC Monitor for s/sx bleeding   Diana L. Marcy Salvoaymond, PharmD, MS PGY1 Pharmacy Resident Pager: [phone]

## 2017-11-20 NOTE — Plan of Care (Signed)
Pre-CABG workup initiated, heparin drip continued, education given to pt of need for continued inpatient care before surgery, pt verbalizes understanding.

## 2017-11-20 NOTE — Consult Note (Signed)
[address]       [place] [address]             [phone]      Cardiothoracic Surgery Consultation  Reason for Consult: High grade ostial LAD stenosis with NSTEMI Referring Physician: Dr. Quay Burow  Dale Dale [name] is an 61 y.o. Dale [name].  HPI:   The patient is a 61 year old with hypertension and a 45 pk-yr smoking history who presented with a 3 week history of intermittent SSCP that progressed and became severe in the week prior to presentation. He described it as a crushing chest pain with associated shortness of breath, nausea and diaphoresis. He had a couple episodes at work and had to leave. Then on Christmas eve he had a severe episode and went to PCP and was sent to [facility] for further workup. He had a troponin of 0.5 and was Dale here. He has had no further CP since arrival here. Cath shows a 90% ostial LAD stenosis. LVEF was 50-55%. Echo showed the same LVEF with mild aortic root dilation at 38 mm. The aortic valve was normal.  Past Medical History:  Diagnosis Date  . HTN (hypertension)   . Tobacco Dale [name]      Family History  Problem Relation Age of Onset  . Bradycardia Mother     Social History:  reports that he has been smoking cigarettes.  He has smoked for the past 45.00 years. he has never used smokeless tobacco. His alcohol and drug histories are not on file.  Allergies:  Allergies  Allergen Reactions  . Chantix [Varenicline] Other (See Comments)    Nightmares    Medications:  I have reviewed the patient's current medications. Prior to Admission:  Medications Prior to Admission  Medication Sig Dispense Refill Last Dose  . Aspirin-Salicylamide-Caffeine (BC HEADACHE POWDER PO) Take 1 packet by mouth daily.   11/18/2017 at Unknown time  . lisinopril (PRINIVIL,ZESTRIL) 20 MG tablet Take 20 mg by mouth 2 (two) times daily.   11/18/2017 at Unknown time   Scheduled: . amLODipine  5 mg Oral Daily  . aspirin  81 mg Oral  Daily  . atorvastatin  80 mg Oral q1800  . lisinopril  20 mg Oral Daily  . metoprolol tartrate  12.5 mg Oral BID  . nicotine  21 mg Transdermal Daily  . sodium chloride flush  3 mL Intravenous Q12H   Continuous: . sodium chloride    . heparin 1,450 Units/hr (11/20/17 0300)   BWL:SLHTDS chloride, acetaminophen, hydrALAZINE, morphine injection, nitroGLYCERIN, ondansetron (ZOFRAN) IV, sodium chloride flush  Results for orders placed or performed during the hospital encounter of 11/18/17 (from the past 48 hour(s))  HIV antibody (Routine Testing)     Status: None   Collection Time: 11/18/17  6:14 PM  Result Value Ref Range   HIV Screen 4th Generation wRfx Non Reactive Non Reactive    Comment: (NOTE) Performed At: [facility] [place], [place] [id number] Rush Farmer MD [place]:[id number]   Troponin I     Status: Abnormal   Collection Time: 11/18/17  6:14 PM  Result Value Ref Range   Troponin I 0.03 (HH) <0.03 ng/mL    Comment: CRITICAL RESULT CALLED TO, READ BACK BY AND VERIFIED WITH: T [place],RN 1914 11/18/2017 WBOND   Hemoglobin A1c     Status: Abnormal   Collection Time: 11/18/17  6:14 PM  Result Value Ref Range   Hgb A1c MFr Bld 5.9 (  H) 4.8 - 5.6 %    Comment: (NOTE) Pre diabetes:          5.7%-6.4% Diabetes:              >6.4% Glycemic control for   <7.0% adults with diabetes    Mean Plasma Glucose 122.63 mg/dL  Heparin level (unfractionated)     Status: Abnormal   Collection Time: 11/18/17  8:39 PM  Result Value Ref Range   Heparin Unfractionated 0.18 (L) 0.30 - 0.70 IU/mL    Comment:        IF HEPARIN RESULTS ARE BELOW EXPECTED VALUES, AND PATIENT DOSAGE HAS BEEN CONFIRMED, SUGGEST FOLLOW UP TESTING OF ANTITHROMBIN III LEVELS.   Troponin I     Status: None   Collection Time: 11/19/17 12:35 AM  Result Value Ref Range   Troponin I <0.03 <0.03 ng/mL  Lipid panel     Status: Abnormal   Collection Time: 11/19/17 12:35 AM  Result Value Ref  Range   Cholesterol 179 0 - 200 mg/dL   Triglycerides 117 <150 mg/dL   HDL 52 >40 mg/dL   Total CHOL/HDL Ratio 3.4 RATIO   VLDL 23 0 - 40 mg/dL   LDL Cholesterol 104 (H) 0 - 99 mg/dL    Comment:        Total Cholesterol/HDL:CHD Risk Coronary Heart Disease Risk Table                     Men   Women  1/2 Average Risk   3.4   3.3  Average Risk       5.0   4.4  2 X Average Risk   9.6   7.1  3 X Average Risk  23.4   11.0        Dale [name] the calculated Patient Ratio above and the CHD Risk Table to determine the patient's CHD Risk.        ATP III CLASSIFICATION (LDL):  <100     mg/dL   Optimal  100-129  mg/dL   Near or Above                    Optimal  130-159  mg/dL   Borderline  160-189  mg/dL   High  >190     mg/dL   Very High   Troponin I     Status: None   Collection Time: 11/19/17  4:52 AM  Result Value Ref Range   Troponin I <0.03 <0.03 ng/mL  Basic metabolic panel     Status: Abnormal   Collection Time: 11/19/17  4:52 AM  Result Value Ref Range   Sodium 133 (L) 135 - 145 mmol/L   Potassium 3.8 3.5 - 5.1 mmol/L   Chloride 102 101 - 111 mmol/L   CO2 23 22 - 32 mmol/L   Glucose, Bld 92 65 - 99 mg/dL   BUN 12 6 - 20 mg/dL   Creatinine, Ser 0.89 0.61 - 1.24 mg/dL   Calcium 8.4 (L) 8.9 - 10.3 mg/dL   GFR calc non Af Amer >60 >60 mL/min   GFR calc Af Amer >60 >60 mL/min    Comment: (NOTE) The eGFR has been calculated using the CKD EPI equation. This calculation has not been validated in all clinical situations. eGFR's persistently <60 mL/min signify possible Chronic Kidney Disease.    Anion gap 8 5 - 15  CBC     Status: None   Collection Time: 11/19/17  4:52 AM  Result Value Ref Range   WBC 6.5 4.0 - 10.5 K/uL   RBC 5.00 4.22 - 5.81 MIL/uL   Hemoglobin 16.4 13.0 - 17.0 g/dL   HCT 47.9 39.0 - 52.0 %   MCV 95.8 78.0 - 100.0 fL   MCH 32.8 26.0 - 34.0 pg   MCHC 34.2 30.0 - 36.0 g/dL   RDW 13.1 11.5 - 15.5 %   Platelets 196 150 - 400 K/uL  Protime-INR     Status: None     Collection Time: 11/19/17  4:52 AM  Result Value Ref Range   Prothrombin Time 13.2 11.4 - 15.2 seconds   INR 1.01   Heparin level (unfractionated)     Status: None   Collection Time: 11/19/17  4:52 AM  Result Value Ref Range   Heparin Unfractionated 0.55 0.30 - 0.70 IU/mL    Comment:        IF HEPARIN RESULTS ARE BELOW EXPECTED VALUES, AND PATIENT DOSAGE HAS BEEN CONFIRMED, SUGGEST FOLLOW UP TESTING OF ANTITHROMBIN III LEVELS.   Heparin level (unfractionated)     Status: Abnormal   Collection Time: 11/20/17 12:58 AM  Result Value Ref Range   Heparin Unfractionated 0.28 (L) 0.30 - 0.70 IU/mL    Comment:        IF HEPARIN RESULTS ARE BELOW EXPECTED VALUES, AND PATIENT DOSAGE HAS BEEN CONFIRMED, SUGGEST FOLLOW UP TESTING OF ANTITHROMBIN III LEVELS.   CBC     Status: None   Collection Time: 11/20/17 12:58 AM  Result Value Ref Range   WBC 7.1 4.0 - 10.5 K/uL   RBC 5.01 4.22 - 5.81 MIL/uL   Hemoglobin 16.7 13.0 - 17.0 g/dL   HCT 48.3 39.0 - 52.0 %   MCV 96.4 78.0 - 100.0 fL   MCH 33.3 26.0 - 34.0 pg   MCHC 34.6 30.0 - 36.0 g/dL   RDW 13.2 11.5 - 15.5 %   Platelets 192 150 - 400 K/uL  Basic metabolic panel     Status: Abnormal   Collection Time: 11/20/17 12:58 AM  Result Value Ref Range   Sodium 132 (L) 135 - 145 mmol/L   Potassium 3.7 3.5 - 5.1 mmol/L   Chloride 101 101 - 111 mmol/L   CO2 23 22 - 32 mmol/L   Glucose, Bld 97 65 - 99 mg/dL   BUN 11 6 - 20 mg/dL   Creatinine, Ser 0.91 0.61 - 1.24 mg/dL   Calcium 8.5 (L) 8.9 - 10.3 mg/dL   GFR calc non Af Amer >60 >60 mL/min   GFR calc Af Amer >60 >60 mL/min    Comment: (NOTE) The eGFR has been calculated using the CKD EPI equation. This calculation has not been validated in all clinical situations. eGFR's persistently <60 mL/min signify possible Chronic Kidney Disease.    Anion gap 8 5 - 15  Heparin level (unfractionated)     Status: None   Collection Time: 11/20/17  8:19 AM  Result Value Ref Range   Heparin  Unfractionated 0.35 0.30 - 0.70 IU/mL    Comment:        IF HEPARIN RESULTS ARE BELOW EXPECTED VALUES, AND PATIENT DOSAGE HAS BEEN CONFIRMED, SUGGEST FOLLOW UP TESTING OF ANTITHROMBIN III LEVELS.     No results found.  Review of Systems  Constitutional: Positive for diaphoresis. Negative for chills, fever and malaise/fatigue.  HENT: Negative.   Eyes: Negative.   Respiratory: Positive for shortness of breath.   Cardiovascular: Positive for chest pain. Negative for palpitations, orthopnea,  leg swelling and PND.  Gastrointestinal: Positive for nausea.  Genitourinary: Negative.   Musculoskeletal: Negative.   Skin: Negative.   Neurological: Negative.   Endo/Heme/Allergies: Negative.   Psychiatric/Behavioral: Negative.    Blood pressure (!) 135/99, pulse 73, temperature 98.4 F (36.9 C), temperature source Oral, resp. rate 18, height 5' 8"  (1.727 m), weight 86.7 kg (191 lb 1.6 oz), SpO2 98 %. Physical Exam  Constitutional: He is oriented to person, place, and time. He appears well-developed and well-nourished. No distress.  HENT:  Head: Normocephalic and atraumatic.  Mouth/Throat: Oropharynx is clear and moist.  Eyes: EOM are normal. Pupils are equal, round, and reactive to light.  Neck: Normal range of motion. Neck supple. No JVD present. No thyromegaly present.  Cardiovascular: Normal rate, regular rhythm, normal heart sounds and intact distal pulses.  No murmur heard. Respiratory: Effort normal and breath sounds normal. No respiratory distress. He has no rales.  GI: Soft. Bowel sounds are normal. He exhibits no distension and no mass. There is no tenderness.  Musculoskeletal: Normal range of motion. He exhibits no edema.  Lymphadenopathy:    He has no cervical adenopathy.  Neurological: He is alert and oriented to person, place, and time.  Skin: Skin is warm and dry.  Psychiatric: He has a normal mood and affect.            *[facility]*                         [address], [place] [address]                            [phone]  ------------------------------------------------------------------- Echocardiography  Patient:    Dale Dale [name], Dale Dale [name] MR #:       [id number] Study Date: 11/19/2017 Gender:     M Age:        61 Height:     172.7 cm Weight:     90 kg BSA:        2.1 m^2 Pt. Status: Room:       6E20C   ADMITTING    Quay Burow, MD  ATTENDING    Quay Burow, MD  Dale Dale [name], Dale Dale [name], Dale Dale [name], Dale Dale [name]  SONOGRAPHER  Mikki Santee  cc:  ------------------------------------------------------------------- LV EF: 50% -   55%  ------------------------------------------------------------------- Indications:      Chest pain 786.51.  ------------------------------------------------------------------- History:   Risk factors:  Current tobacco Dale [name]. Hypertension.  ------------------------------------------------------------------- Study Conclusions  - Left ventricle: The cavity size was normal. Systolic function was   normal. The estimated ejection fraction was in the range of 50%   to 55%. Features are consistent with a pseudonormal left   ventricular filling pattern, with concomitant abnormal relaxation   and increased filling pressure (grade 2 diastolic dysfunction). - Aorta: Aortic root dimension: 38 mm (ED). - Aortic root: The aortic root was mildly dilated. - Left atrium: The atrium was mildly dilated. - Pulmonary arteries: Systolic pressure could not be accurately   estimated.  ------------------------------------------------------------------- Study data:  No prior study was available for comparison.  Study status:  Routine.  Procedure:  The patient reported no pain pre or post test. Transthoracic echocardiography. Image quality was adequate.  Study completion:  There  were no complications. Echocardiography.  M-mode, complete 2D, spectral Doppler, and color Doppler.  Birthdate:  Patient birthdate: Apr 21, 1956.  Age:  Patient is 61 yr old.  Sex:  Gender: Dale [name].    BMI: 30.2 kg/m^2.  Blood pressure:     144/93  Patient status:  Dale Dale [name].  Study date: Study date: 11/19/2017. Study time: 10:40 AM.  Location:  Echo laboratory.  -------------------------------------------------------------------  ------------------------------------------------------------------- Left ventricle:  The cavity size was normal. Systolic function was normal. The estimated ejection fraction was in the range of 50% to 55%. Features are consistent with a pseudonormal left ventricular filling pattern, with concomitant abnormal relaxation and increased filling pressure (grade 2 diastolic dysfunction).  ------------------------------------------------------------------- Aortic valve:   Trileaflet; normal thickness leaflets. Mobility was not restricted.  Doppler:  Transvalvular velocity was within the normal range. There was no stenosis. There was no regurgitation.   ------------------------------------------------------------------- Aorta:  Aortic root: The aortic root was mildly dilated. Ascending aorta: The ascending aorta was mildly dilated.  ------------------------------------------------------------------- Mitral valve:   Structurally normal valve.   Mobility was not restricted.  Doppler:  Transvalvular velocity was within the normal range. There was no evidence for stenosis. There was trivial regurgitation.  ------------------------------------------------------------------- Left atrium:  The atrium was mildly dilated.  ------------------------------------------------------------------- Right ventricle:  The cavity size was normal. Wall thickness was normal. Systolic function was  normal.  ------------------------------------------------------------------- Pulmonic valve:    Structurally normal valve.   Cusp separation was normal.  Doppler:  Transvalvular velocity was within the normal range. There was no evidence for stenosis. There was no regurgitation.  ------------------------------------------------------------------- Tricuspid valve:   Structurally normal valve.    Doppler: Transvalvular velocity was within the normal range. There was no regurgitation.  ------------------------------------------------------------------- Pulmonary artery:   The main pulmonary artery was normal-sized. Systolic pressure could not be accurately estimated.  ------------------------------------------------------------------- Right atrium:  The atrium was normal in size.  ------------------------------------------------------------------- Pericardium:  There was no pericardial effusion.  ------------------------------------------------------------------- Systemic veins: Inferior vena cava: The vessel was normal in size.  ------------------------------------------------------------------- Measurements   Left ventricle                           Value        Reference  LV ID, ED, PLAX chordal                  48.9  mm     43 - 52  LV ID, ES, PLAX chordal        (H)       41.7  mm     23 - 38  LV fx shortening, PLAX chordal (L)       15    %      >=29  LV PW thickness, ED                      9.96  mm     ----------  IVS/LV PW ratio, ED                      0.88         <=1.3  Stroke volume, 2D                        72  ml     ----------  Stroke volume/bsa, 2D                    34    ml/m^2 ----------  LV e&', lateral                           7.62  cm/s   ----------  LV E/e&', lateral                         7.57         ----------  LV e&', medial                            5.98  cm/s   ----------  LV E/e&', medial                          9.65          ----------  LV e&', average                           6.8   cm/s   ----------  LV E/e&', average                         8.49         ----------    Ventricular septum                       Value        Reference  IVS thickness, ED                        8.75  mm     ----------    LVOT                                     Value        Reference  LVOT ID, S                               23    mm     ----------  LVOT area                                4.15  cm^2   ----------  LVOT peak velocity, S                    84.2  cm/s   ----------  LVOT mean velocity, S                    50.2  cm/s   ----------  LVOT VTI, S                              17.3  cm     ----------  LVOT peak gradient, S                    3     mm Hg  ----------  Aorta                                    Value        Reference  Aortic root ID, ED                       38    mm     ----------    Left atrium                              Value        Reference  LA ID, A-P, ES                           41    mm     ----------  LA ID/bsa, A-P                           1.95  cm/m^2 <=2.2  LA volume, S                             82.2  ml     ----------  LA volume/bsa, S                         39.1  ml/m^2 ----------  LA volume, ES, 1-p A4C                   54    ml     ----------  LA volume/bsa, ES, 1-p A4C               25.7  ml/m^2 ----------  LA volume, ES, 1-p A2C                   115   ml     ----------  LA volume/bsa, ES, 1-p A2C               54.7  ml/m^2 ----------    Mitral valve                             Value        Reference  Mitral E-wave peak velocity              57.7  cm/s   ----------  Mitral A-wave peak velocity              36.4  cm/s   ----------  Mitral deceleration time       (H)       327   ms     150 - 230  Mitral E/A ratio, peak                   1.7          ----------    Right atrium                             Value        Reference  RA ID, S-I, ES, A4C            (H)  56.3  mm      34 - 49  RA area, ES, A4C               (H)       19.7  cm^2   8.3 - 19.5  RA volume, ES, A/L                       56.7  ml     ----------  RA volume/bsa, ES, A/L                   27    ml/m^2 ----------    Right ventricle                          Value        Reference  TAPSE                                    18.9  mm     ----------  RV s&', lateral, S                        16.4  cm/s   ----------  Legend: (L)  and  (H)  mark values outside specified reference range.  ------------------------------------------------------------------- Prepared and Electronically Authenticated by  Fransico Him, MD 2018-12-27T12:14:28  Physicians   Panel Physicians Referring Physician Case Authorizing Physician  Lorretta Harp, MD (Primary)    Procedures   LEFT HEART CATH AND CORONARY ANGIOGRAPHY  Conclusion     Dist LM to Ost LAD lesion is 90% stenosed.  The left ventricular systolic function is normal.  LV end diastolic pressure is normal.  The left ventricular ejection fraction is 50-55% by visual estimate.   Dale Dale [name] is a 61 y.o. Dale [name]    [id number] LOCATION:  FACILITY: [facility]  PHYSICIAN: Quay Burow, M.D. 02-06-1956   DATE OF PROCEDURE:  11/19/2017  DATE OF DISCHARGE:     CARDIAC CATHETERIZATION     History obtained from chart review.Dale Dale [name], Dale from OSH for unstable stable angina and has ruled in for NSTEMI.    IMPRESSION: Dale Dale [name] has a high-grade Ostial LAD lesion with a codominant circulation with preserved LV function.  This is not anatomically optimal for percutaneous revascularization.  This is best treated with a LIMA to the LAD.  I have reviewed his endograft with Dr. Claiborne Billings who agrees with my recommendations.  The sheath was removed and a TR band was placed on the right wrist to achieve patent hemostasis.  T CTS has been notified.  Heparin will be restarted in  2 hours.  There The patient left the lab in stable condition.    Quay Burow. MD, [facility] 11/19/2017 4:48 PM      Indications   Non-STEMI (non-ST elevated myocardial infarction) ([place]) [I21.4 (ICD-10-CM)]  Procedural Details/Technique   Technical Details PROCEDURE DESCRIPTION:   The patient was brought to the second floor [place] [facility]iac cath lab in the postabsorptive state. He was premedicated with Valium 5 mg p.o., IV Versed and fentanyl. His right wrist was prepped and shaved in usual sterile fashion. Xylocaine 1% was used for local anesthesia. A 6 French sheath was inserted into the right radial artery using standard Seldinger technique. The patient received 4500 units of heparin  intravenously. A 5 [place] TIG catheter and pigtail catheters were used for selective coronary angiography and left ventriculography respectively. Isovue dye was used for the entirety of the case. Retrograde aortic, left ventricular and pullback pressures were recorded. Radial cocktail was administered via the SideArm sheath.   Estimated blood loss <50 mL.  During this procedure the patient was administered the following to achieve and maintain moderate conscious sedation: Versed 1 mg, Fentanyl 25 mcg, while the patient's heart rate, blood pressure, and oxygen saturation were continuously monitored. The period of conscious sedation was 27 minutes, of which I was present face-to-face 100% of this time.  Coronary Findings   Diagnostic  Dominance: Co-dominant  Left Main  Dist LM to Ost LAD lesion 90% stenosed  Dist LM to Ost LAD lesion is 90% stenosed.  Intervention   No interventions have been documented.  Wall Motion      All segments of the heart are normal.          Left Heart   Left Ventricle The left ventricular size is normal. The left ventricular systolic function is normal. LV end diastolic pressure is normal. The left ventricular ejection fraction is 50-55% by visual estimate. No  regional wall motion abnormalities.  Coronary Diagrams   Diagnostic Diagram       Implants     No implant documentation for this case.  MERGE Images   Show images for CARDIAC CATHETERIZATION   Link to Procedure Log   Procedure Log    Hemo Data    Most Recent Value  AO Systolic Pressure 956 mmHg  AO Diastolic Pressure 93 mmHg  AO Mean 387 mmHg  LV Systolic Pressure 564 mmHg  LV Diastolic Pressure 7 mmHg  LV EDP 16 mmHg  Arterial Occlusion Pressure Extended Systolic Pressure 332 mmHg  Arterial Occlusion Pressure Extended Diastolic Pressure 91 mmHg  Arterial Occlusion Pressure Extended Mean Pressure 125 mmHg  Left Ventricular Apex Extended Systolic Pressure 951 mmHg  Left Ventricular Apex Extended Diastolic Pressure 0 mmHg  Left Ventricular Apex Extended EDP Pressure 16 mmHg    Assessment/Plan:  This 61 year old gentleman has severe single vessel CAD with a 90% ostial LAD stenosis presenting with unstable angina and NSTEMI. I agree that CABG with a LIMA to the LAD is the best treatment for him. I discussed the operative procedure with the patient and family including alternatives, benefits and risks; including but not limited to bleeding, blood transfusion, infection, stroke, myocardial infarction, graft failure, heart block requiring a permanent pacemaker, organ dysfunction, and death.  Dale Dale [name] Fread understands and agrees to proceed.  We will schedule surgery for Monday am.  I spent 60 minutes performing this consultation and > 50% of this time was spent face to face counseling and coordinating the care of this patient's severe single vessel coronary artery disease.  Gaye Pollack 11/20/2017, 4:17 PM

## 2017-11-20 NOTE — Progress Notes (Signed)
Pre-CABG testing has been completed. 80-99% right ICA stenosis 1-39% left ICA stenosis 0.99 right ABI 1.03 left ABI  11/20/17 2:27 PM Dale [name] RVT

## 2017-11-20 NOTE — Progress Notes (Signed)
Progress Note  Patient Name: Dale [name] Date of Encounter: 11/20/2017  Primary Cardiologist: Allyson SabalBerry  Subjective   Feeling well this morning. No chest pain.   Inpatient Medications    Scheduled Meds: . amLODipine  5 mg Oral Daily  . aspirin  81 mg Oral Daily  . atorvastatin  80 mg Oral q1800  . lisinopril  20 mg Oral Daily  . nicotine  21 mg Transdermal Daily  . sodium chloride flush  3 mL Intravenous Q12H   Continuous Infusions: . sodium chloride    . heparin 1,450 Units/hr (11/20/17 0300)   PRN Meds: sodium chloride, acetaminophen, hydrALAZINE, morphine injection, nitroGLYCERIN, ondansetron (ZOFRAN) IV, sodium chloride flush   Vital Signs    Vitals:   11/19/17 1631 11/19/17 1636 11/19/17 2017 11/20/17 0645  BP: (!) 168/104 (!) 173/111 (!) 162/95 (!) 174/99  Pulse: 66 64  73  Resp: 20 17  18   Temp:   98.6 F (37 C) 98.5 F (36.9 C)  TempSrc:   Oral Oral  SpO2: 99% 99% 97%   Weight:    191 lb 1.6 oz (86.7 kg)  Height:        Intake/Output Summary (Last 24 hours) at 11/20/2017 0852 Last data filed at 11/20/2017 [id number] Gross per 24 hour  Intake 517.22 ml  Output 2300 ml  Net -1782.78 ml   Filed Weights   11/18/17 1900 11/20/17 0645  Weight: 198 lb 6.6 oz (90 kg) 191 lb 1.6 oz (86.7 kg)    Telemetry    SR - Personally Reviewed  Physical Exam   General: Well developed, well nourished, male appearing in no acute distress. Head: Normocephalic, atraumatic.  Neck: Supple without bruits, JVD. Lungs:  Resp regular and unlabored, CTA. Heart: RRR, S1, S2, no S3, S4, or murmur; no rub. Abdomen: Soft, non-tender, non-distended with normoactive bowel sounds. No hepatomegaly. No rebound/guarding. No obvious abdominal masses. Extremities: No clubbing, cyanosis, edema. Distal pedal pulses are 2+ bilaterally. Neuro: Alert and oriented X 3. Moves all extremities spontaneously. Psych: Normal affect.  Labs    Chemistry Recent Labs  Lab 11/19/17 0452  11/20/17 0058  NA 133* 132*  K 3.8 3.7  CL 102 101  CO2 23 23  GLUCOSE 92 97  BUN 12 11  CREATININE 0.89 0.91  CALCIUM 8.4* 8.5*  GFRNONAA >60 >60  GFRAA >60 >60  ANIONGAP 8 8     Hematology Recent Labs  Lab 11/19/17 0452 11/20/17 0058  WBC 6.5 7.1  RBC 5.00 5.01  HGB 16.4 16.7  HCT 47.9 48.3  MCV 95.8 96.4  MCH 32.8 33.3  MCHC 34.2 34.6  RDW 13.1 13.2  PLT 196 192    Cardiac Enzymes Recent Labs  Lab 11/18/17 1814 11/19/17 0035 11/19/17 0452  TROPONINI 0.03* <0.03 <0.03   No results for input(s): TROPIPOC in the last 168 hours.   BNPNo results for input(s): BNP, PROBNP in the last 168 hours.   DDimer No results for input(s): DDIMER in the last 168 hours.    Radiology    No results found.  Cardiac Studies   Cath: 11/19/17  Conclusion     Dist LM to Ost LAD lesion is 90% stenosed.  The left ventricular systolic function is normal.  LV end diastolic pressure is normal.  The left ventricular ejection fraction is 50-55% by visual estimate.   IMPRESSION: Dale [name] has a high-grade Ostial LAD lesion with a codominant circulation with preserved LV function.  This is not anatomically optimal for  percutaneous revascularization.  This is best treated with a LIMA to the LAD.  I have reviewed his endograft with Dr. Tresa EndoKelly who agrees with my recommendations.  The sheath was removed and a TR band was placed on the right wrist to achieve patent hemostasis.  TCTS has been notified.  Heparin will be restarted in 2 hours.  There The patient left the lab in stable condition.   Nanetta BattyJonathan Berry. MD, [facility]  TTE: 11/19/17  Study Conclusions  - Left ventricle: The cavity size was normal. Systolic function was   normal. The estimated ejection fraction was in the range of 50%   to 55%. Features are consistent with a pseudonormal left   ventricular filling pattern, with concomitant abnormal relaxation   and increased filling pressure (grade 2 diastolic  dysfunction). - Aorta: Aortic root dimension: 38 mm (ED). - Aortic root: The aortic root was mildly dilated. - Left atrium: The atrium was mildly dilated. - Pulmonary arteries: Systolic pressure could not be accurately   estimated.  Patient Profile     61 y.o. male withh/o HTN and 45 year h/o tobacco use, transferred from OSH for unstable stable angina and has ruled in for NSTEMI.  Assessment & Plan    1. NSTEMI: Trop peaked at 0.50. Underwent cath noted above with ostial LAD lesion. Given location, TCTS was consulted. No chest pain overnight. Echo noted EF of 50-55%.  -- on ASA, statin, and IV heparin  2. Tobacco use: cessation advised. Using nicotine patch.  3. HTN: blood pressures still elevated. Will add low dose metoprolol  4. HL: LDL 104, On statin therapy.  Signed, Laverda PageLindsay Roberts, NP  11/20/2017, 8:52 AM  Pager # [phone]   For questions or updates, please contact [facility] Please consult [email or web address] for contact info under Cardiology/STEMI.   I have seen, examined and evaluated the patient this AM along with Laverda PageLindsay Roberts, NP-C.  After reviewing all the available data and chart, we discussed the patients laboratory, study & physical findings as well as symptoms in detail. I agree with her findings, examination as well as impression recommendations as per our discussion.    I have also personally reviewed the patient's cath films to evaluate in the ostial LAD lesion.  This is potentially approachable from a PCI standpoint, however given the location, perhaps LIMA-LAD and possibly SVG-diagnosis the best option.  Circumflex and RCA vessels are not compromised. CT surgery has been consulted.  As long as he remains chest pain-free, will be fine to delay over the weekend but would not discharge home based on LAD lesion. Continue IV heparin along with aspirin and statin.  No Plavix or Brilinta was given. Agree with adding low-dose beta-blocker and continue high-dose  statin.  Await CT surgical consult.   Bryan Lemmaavid Harding, M.D., M.S. Interventional Cardiologist   Pager # [phone] Phone # [phone] [address] [place], [place] [address]

## 2017-11-21 ENCOUNTER — Encounter (HOSPITAL_COMMUNITY): Payer: Self-pay | Admitting: Certified Registered Nurse Anesthetist

## 2017-11-21 LAB — CBC
HEMATOCRIT: 48.3 % (ref 39.0–52.0)
Hemoglobin: 16.8 g/dL (ref 13.0–17.0)
MCH: 33.6 pg (ref 26.0–34.0)
MCHC: 34.8 g/dL (ref 30.0–36.0)
MCV: 96.6 fL (ref 78.0–100.0)
PLATELETS: 177 10*3/uL (ref 150–400)
RBC: 5 MIL/uL (ref 4.22–5.81)
RDW: 13.5 % (ref 11.5–15.5)
WBC: 5.9 10*3/uL (ref 4.0–10.5)

## 2017-11-21 LAB — HEPARIN LEVEL (UNFRACTIONATED): Heparin Unfractionated: 0.44 IU/mL (ref 0.30–0.70)

## 2017-11-21 NOTE — Progress Notes (Signed)
2 Days Post-Op Procedure(s) (LRB): LEFT HEART CATH AND CORONARY ANGIOGRAPHY (N/A) Subjective:  Had brief left chest pain this am after walking relieved with SL NTG. No recurrence. Otherwise feels well.  Objective: Vital signs in last 24 hours: Temp:  [98.2 F (36.8 C)-98.9 F (37.2 C)] 98.2 F (36.8 C) (12/29 1403) Pulse Rate:  [66-72] 66 (12/29 1403) Cardiac Rhythm: Normal sinus rhythm (12/29 0854) Resp:  [18] 18 (12/29 1403) BP: (129-174)/(85-105) 148/91 (12/29 1403) SpO2:  [94 %-98 %] 98 % (12/29 1403) Weight:  [85.7 kg (189 lb)] 85.7 kg (189 lb) (12/29 0547)  Hemodynamic parameters for last 24 hours:    Intake/Output from previous day: 12/28 0701 - 12/29 0700 In: 458.3 [P.O.:120; I.V.:338.3] Out: 200 [Urine:200] Intake/Output this shift: Total I/O In: 120 [P.O.:120] Out: -   General appearance: alert and cooperative Heart: regular rate and rhythm, S1, S2 normal, no murmur, click, rub or gallop Lungs: clear to auscultation bilaterally  Lab Results: Recent Labs    11/20/17 0058 11/21/17 0336  WBC 7.1 5.9  HGB 16.7 16.8  HCT 48.3 48.3  PLT 192 177   BMET:  Recent Labs    11/19/17 0452 11/20/17 0058  NA 133* 132*  K 3.8 3.7  CL 102 101  CO2 23 23  GLUCOSE 92 97  BUN 12 11  CREATININE 0.89 0.91  CALCIUM 8.4* 8.5*    PT/INR:  Recent Labs    11/19/17 0452  LABPROT 13.2  INR 1.01   ABG No results found for: PHART, HCO3, TCO2, ACIDBASEDEF, O2SAT CBG (last 3)  No results for input(s): GLUCAP in the last 72 hours.  Assessment/Plan:  High grade ostial LAD stenosis. Plan CABG on Monday morning.   LOS: 3 days    Alleen BorneBryan K Lanis Storlie 11/21/2017

## 2017-11-21 NOTE — Progress Notes (Signed)
ANTICOAGULATION CONSULT NOTE - Follow Up Consult  Pharmacy Consult for Heparin Indication: NSTEMI  Allergies  Allergen Reactions  . Chantix [Varenicline] Other (See Comments)    Nightmares   Patient Measurements: Height: 5\' 8"  (172.7 cm) Weight: 189 lb (85.7 kg) IBW/kg (Calculated) : 68.4 Heparin Dosing Weight: 87 kg  Vital Signs: Temp: 98.9 F (37.2 C) (12/29 0547) Temp Source: Oral (12/29 0547) BP: 129/85 (12/29 0617) Pulse Rate: 71 (12/29 0547)  Labs: Recent Labs    11/18/17 1814  11/19/17 0035  11/19/17 0452 11/20/17 0058 11/20/17 0819 11/21/17 0336  HGB  --   --   --    < > 16.4 16.7  --  16.8  HCT  --   --   --   --  47.9 48.3  --  48.3  PLT  --   --   --   --  196 192  --  177  LABPROT  --   --   --   --  13.2  --   --   --   INR  --   --   --   --  1.01  --   --   --   HEPARINUNFRC  --    < >  --   --  0.55 0.28* 0.35 0.44  CREATININE  --   --   --   --  0.89 0.91  --   --   TROPONINI 0.03*  --  <0.03  --  <0.03  --   --   --    < > = values in this interval not displayed.    Estimated Creatinine Clearance: 91.9 mL/min (by C-G formula based on SCr of 0.91 mg/dL).  Assessment: 61 [facility], s/p cardiac cath 12/27 noted with ostial LAD lesion. Heparin level remains therapeutic this AM at 0.44. CBC stable WNL. No bleeding reported.  Goal of Therapy:  Heparin level 0.3-0.7 units/ml Monitor platelets by anticoagulation protocol: Yes   Plan:  Continue heparin gtt at 1450 units/hr  Daily heparin level and CBC Monitor for s/sx bleeding  Ashelyn Mccravy N. Zigmund Danieleja, PharmD PGY1 Pharmacy Resident Pager: [phone]

## 2017-11-21 NOTE — Plan of Care (Signed)
Education provided to pt about upcoming open heart surgery

## 2017-11-21 NOTE — Progress Notes (Signed)
Pt compalined of 5/10 chest pain and shortness of breath. BP 169/98. Gave 1 sublingual nitro. Pt advised pain is now 0/10 and BP 129/85. MD notified. Will continue to monitor.

## 2017-11-21 NOTE — Progress Notes (Signed)
[id number] Did not walk with pt since he has LM disease. Gave pt OHS booklet and care guide. Discussed sternal precautions and demonstrated how to get up and down without use of arms. Gave pt IS and pt was able to demonstrate 1250-1500 ml correctly. Discussed smoking cessation. Pt did not want fake cigarette as he stated the nicotine patch is working well for him. Pt does not want to view pre op video. Pt stated his wife will be available 24/7 after discharge. Will follow up after surgery. Luetta NuttingCharlene Selby Foisy RN BSN 11/21/2017 10:41 AM

## 2017-11-21 NOTE — Progress Notes (Signed)
Subjective:  No recurrence of chest pain.  No shortness of breath.  No bleeding on heparin.  Objective:  Vital Signs in the last 24 hours: BP 129/85   Pulse 71   Temp 98.9 F (37.2 C) (Oral)   Resp 18   Ht 5\' 8"  (1.727 m)   Wt 85.7 kg (189 lb)   SpO2 96%   BMI 28.74 kg/m   Physical Exam: Pleasant male sitting in chair in no acute distress Lungs:  Clear Cardiac:  Regular rhythm, normal S1 and S2, no S3 Extremities:  No edema present, radial catheterization site clean and dry  Intake/Output from previous day: 12/28 0701 - 12/29 0700 In: 458.3 [P.O.:120; I.V.:338.3] Out: 200 [Urine:200]  Weight Filed Weights   11/18/17 1900 11/20/17 0645 11/21/17 0547  Weight: 90 kg (198 lb 6.6 oz) 86.7 kg (191 lb 1.6 oz) 85.7 kg (189 lb)    Lab Results: Basic Metabolic Panel: Recent Labs    11/19/17 0452 11/20/17 0058  NA 133* 132*  K 3.8 3.7  CL 102 101  CO2 23 23  GLUCOSE 92 97  BUN 12 11  CREATININE 0.89 0.91   CBC: Recent Labs    11/20/17 0058 11/21/17 0336  WBC 7.1 5.9  HGB 16.7 16.8  HCT 48.3 48.3  MCV 96.4 96.6  PLT 192 177    Cardiac Panel (last 3 results) Recent Labs    11/18/17 1814 11/19/17 0035 11/19/17 0452  TROPONINI 0.03* <0.03 <0.03    Telemetry: Sinus rhythm, personally reviewed  Assessment/Plan:  1.  Unstable angina with ostial LAD lesion awaiting bypass grafting  Recommendations:  Clinically stable overnight.  Questions answered regarding disease process.  Stable at the present time awaiting bypass on Monday     W. Ashley RoyaltySpencer Tilley, Jr.  MD [facility]  11/21/2017, 9:51 AM

## 2017-11-22 ENCOUNTER — Encounter (HOSPITAL_COMMUNITY): Payer: Self-pay | Admitting: Anesthesiology

## 2017-11-22 LAB — CBC
HEMATOCRIT: 47.4 % (ref 39.0–52.0)
HEMOGLOBIN: 16.2 g/dL (ref 13.0–17.0)
MCH: 33.1 pg (ref 26.0–34.0)
MCHC: 34.2 g/dL (ref 30.0–36.0)
MCV: 96.9 fL (ref 78.0–100.0)
Platelets: 163 10*3/uL (ref 150–400)
RBC: 4.89 MIL/uL (ref 4.22–5.81)
RDW: 13.3 % (ref 11.5–15.5)
WBC: 6.9 10*3/uL (ref 4.0–10.5)

## 2017-11-22 LAB — HEPARIN LEVEL (UNFRACTIONATED): Heparin Unfractionated: 0.45 IU/mL (ref 0.30–0.70)

## 2017-11-22 LAB — TYPE AND SCREEN
ABO/RH(D): O POS
Antibody Screen: NEGATIVE

## 2017-11-22 LAB — SURGICAL PCR SCREEN
MRSA, PCR: NEGATIVE
STAPHYLOCOCCUS AUREUS: NEGATIVE

## 2017-11-22 LAB — ABO/RH: ABO/RH(D): O POS

## 2017-11-22 MED ORDER — MILRINONE LACTATE IN DEXTROSE 20-5 MG/100ML-% IV SOLN
0.1250 ug/kg/min | INTRAVENOUS | Status: DC
  Filled 2017-11-22 (×2): qty 100

## 2017-11-22 MED ORDER — TRANEXAMIC ACID 1000 MG/10ML IV SOLN
1.5000 mg/kg/h | INTRAVENOUS | Status: DC
  Filled 2017-11-22: qty 25

## 2017-11-22 MED ORDER — BISACODYL 5 MG PO TBEC
5.0000 mg | DELAYED_RELEASE_TABLET | Freq: Once | ORAL | Status: DC
  Filled 2017-11-22: qty 1

## 2017-11-22 MED ORDER — CHLORHEXIDINE GLUCONATE 0.12 % MT SOLN
15.0000 mL | Freq: Once | OROMUCOSAL | Status: AC
  Administered 2017-11-23: 15 mL via OROMUCOSAL
  Filled 2017-11-22: qty 15

## 2017-11-22 MED ORDER — POTASSIUM CHLORIDE 2 MEQ/ML IV SOLN
80.0000 meq | INTRAVENOUS | Status: DC
  Filled 2017-11-22: qty 40

## 2017-11-22 MED ORDER — CHLORHEXIDINE GLUCONATE CLOTH 2 % EX PADS
6.0000 | MEDICATED_PAD | Freq: Once | CUTANEOUS | Status: AC
  Administered 2017-11-22: 6 via TOPICAL

## 2017-11-22 MED ORDER — DEXTROSE 5 % IV SOLN
0.0000 ug/min | INTRAVENOUS | Status: DC
  Filled 2017-11-22: qty 4

## 2017-11-22 MED ORDER — TRANEXAMIC ACID (OHS) BOLUS VIA INFUSION
15.0000 mg/kg | INTRAVENOUS | Status: DC
  Filled 2017-11-22: qty 1290

## 2017-11-22 MED ORDER — NITROGLYCERIN IN D5W 200-5 MCG/ML-% IV SOLN
2.0000 ug/min | INTRAVENOUS | Status: DC
  Administered 2017-11-22: 5 ug/min via INTRAVENOUS
  Filled 2017-11-22: qty 250

## 2017-11-22 MED ORDER — DOPAMINE-DEXTROSE 3.2-5 MG/ML-% IV SOLN
0.0000 ug/kg/min | INTRAVENOUS | Status: DC
  Filled 2017-11-22: qty 250

## 2017-11-22 MED ORDER — MILRINONE LACTATE IN DEXTROSE 20-5 MG/100ML-% IV SOLN
0.1250 ug/kg/min | INTRAVENOUS | Status: DC
  Filled 2017-11-22: qty 100

## 2017-11-22 MED ORDER — DEXTROSE 5 % IV SOLN
1.5000 g | INTRAVENOUS | Status: DC
  Filled 2017-11-22: qty 1.5

## 2017-11-22 MED ORDER — NITROGLYCERIN IN D5W 200-5 MCG/ML-% IV SOLN
2.0000 ug/min | INTRAVENOUS | Status: DC
  Filled 2017-11-22: qty 250

## 2017-11-22 MED ORDER — MAGNESIUM SULFATE 50 % IJ SOLN
40.0000 meq | INTRAMUSCULAR | Status: DC
  Filled 2017-11-22: qty 9.85

## 2017-11-22 MED ORDER — DEXMEDETOMIDINE HCL IN NACL 400 MCG/100ML IV SOLN
0.1000 ug/kg/h | INTRAVENOUS | Status: DC
  Filled 2017-11-22: qty 100

## 2017-11-22 MED ORDER — METOPROLOL TARTRATE 12.5 MG HALF TABLET
12.5000 mg | ORAL_TABLET | Freq: Once | ORAL | Status: AC
  Administered 2017-11-23: 12.5 mg via ORAL
  Filled 2017-11-22: qty 1

## 2017-11-22 MED ORDER — TRANEXAMIC ACID (OHS) PUMP PRIME SOLUTION
2.0000 mg/kg | INTRAVENOUS | Status: DC
  Filled 2017-11-22: qty 1.72

## 2017-11-22 MED ORDER — INSULIN REGULAR HUMAN 100 UNIT/ML IJ SOLN
INTRAMUSCULAR | Status: DC
  Filled 2017-11-22: qty 1

## 2017-11-22 MED ORDER — VANCOMYCIN HCL 10 G IV SOLR
1500.0000 mg | INTRAVENOUS | Status: DC
  Filled 2017-11-22: qty 1500

## 2017-11-22 MED ORDER — DIAZEPAM 5 MG PO TABS
5.0000 mg | ORAL_TABLET | Freq: Once | ORAL | Status: AC
  Administered 2017-11-23: 5 mg via ORAL
  Filled 2017-11-22: qty 1

## 2017-11-22 MED ORDER — BISACODYL 5 MG PO TBEC
5.0000 mg | DELAYED_RELEASE_TABLET | Freq: Once | ORAL | Status: AC
  Administered 2017-11-22: 5 mg via ORAL
  Filled 2017-11-22: qty 1

## 2017-11-22 MED ORDER — CEFUROXIME SODIUM 750 MG IJ SOLR
750.0000 mg | INTRAMUSCULAR | Status: DC
  Filled 2017-11-22: qty 750

## 2017-11-22 MED ORDER — PLASMA-LYTE 148 IV SOLN
INTRAVENOUS | Status: DC
  Filled 2017-11-22: qty 2.5

## 2017-11-22 MED ORDER — CHLORHEXIDINE GLUCONATE CLOTH 2 % EX PADS
6.0000 | MEDICATED_PAD | Freq: Once | CUTANEOUS | Status: AC
  Administered 2017-11-23: 6 via TOPICAL

## 2017-11-22 MED ORDER — SODIUM CHLORIDE 0.9 % IV SOLN
INTRAVENOUS | Status: DC
  Filled 2017-11-22: qty 30

## 2017-11-22 MED ORDER — SODIUM CHLORIDE 0.9 % IV SOLN
30.0000 ug/min | INTRAVENOUS | Status: DC
  Filled 2017-11-22: qty 2

## 2017-11-22 MED ORDER — TEMAZEPAM 15 MG PO CAPS
15.0000 mg | ORAL_CAPSULE | Freq: Once | ORAL | Status: AC | PRN
  Administered 2017-11-22: 15 mg via ORAL
  Filled 2017-11-22: qty 1

## 2017-11-22 NOTE — Anesthesia Preprocedure Evaluation (Addendum)
Anesthesia Evaluation  Patient identified by MRN, date of birth, ID band Patient awake    Reviewed: Allergy & Precautions, NPO status   Airway Mallampati: II  TM Distance: >3 FB Neck ROM: Full    Dental  (+) Teeth Intact, Dental Advisory Given   Pulmonary Current Smoker,    breath sounds clear to auscultation       Cardiovascular hypertension, Pt. on medications + angina at rest + Past MI   Rhythm:Regular Rate:Normal     Neuro/Psych negative neurological ROS  negative psych ROS   GI/Hepatic negative GI ROS, Neg liver ROS,   Endo/Other  negative endocrine ROS  Renal/GU negative Renal ROS  negative genitourinary   Musculoskeletal negative musculoskeletal ROS (+)   Abdominal Normal abdominal exam  (+)   Peds  Hematology negative hematology ROS (+)   Anesthesia Other Findings   Reproductive/Obstetrics negative OB ROS                             Lab Results  Component Value Date   WBC 6.9 11/22/2017   HGB 16.2 11/22/2017   HCT 47.4 11/22/2017   MCV 96.9 11/22/2017   PLT 163 11/22/2017   Lab Results  Component Value Date   CREATININE 0.91 11/20/2017   BUN 11 11/20/2017   NA 132 (L) 11/20/2017   K 3.7 11/20/2017   CL 101 11/20/2017   CO2 23 11/20/2017   Lab Results  Component Value Date   INR 1.01 11/19/2017   EKG: NSR  Cath: Dist LM to Ost LAD lesion is 90% stenosed.   The left ventricular systolic function is normal. Show more     LV end diastolic pressure is normal.   The left ventricular ejection fraction is 50-55% by visual estimate.      Anesthesia Physical Anesthesia Plan  ASA: IV  Anesthesia Plan: General   Post-op Pain Management:    Induction: Intravenous  PONV Risk Score and Plan: 1 and Treatment may vary due to age or medical condition  Airway Management Planned: Oral ETT  Additional Equipment: Arterial line, CVP, PA Cath, TEE and Ultrasound  Guidance Line Placement  Intra-op Plan:   Post-operative Plan: Post-operative intubation/ventilation  Informed Consent:   Plan Discussed with: CRNA  Anesthesia Plan Comments:         Anesthesia Quick Evaluation

## 2017-11-22 NOTE — Progress Notes (Signed)
ANTICOAGULATION CONSULT NOTE - Follow Up Consult  Pharmacy Consult for Heparin Indication: NSTEMI  Allergies  Allergen Reactions  . Chantix [Varenicline] Other (See Comments)    Nightmares   Patient Measurements: Height: 5\' 8"  (172.7 cm) Weight: 189 lb 8 oz (86 kg) IBW/kg (Calculated) : 68.4 Heparin Dosing Weight: 87 kg  Vital Signs: Temp: 98 F (36.7 C) (12/30 0408) Temp Source: Oral (12/30 0408) BP: 137/82 (12/30 0442) Pulse Rate: 63 (12/30 0408)  Labs: Recent Labs    11/20/17 0058 11/20/17 0819 11/21/17 0336 11/22/17 0349  HGB 16.7  --  16.8 16.2  HCT 48.3  --  48.3 47.4  PLT 192  --  177 163  HEPARINUNFRC 0.28* 0.35 0.44 0.45  CREATININE 0.91  --   --   --     Estimated Creatinine Clearance: 92.1 mL/min (by C-G formula based on SCr of 0.91 mg/dL).  Assessment: 61 [facility], s/p cardiac cath 12/27 noted with ostial LAD lesion. Heparin level remains therapeutic this AM at 0.45. CBC stable WNL. No bleeding reported.  Goal of Therapy:  Heparin level 0.3-0.7 units/ml Monitor platelets by anticoagulation protocol: Yes   Plan:  Continue heparin gtt at 1450 units/hr  Daily heparin level and CBC Monitor for s/sx bleeding  Nikoletta Varma N. Zigmund Danieleja, PharmD PGY1 Pharmacy Resident Pager: [phone]

## 2017-11-22 NOTE — Progress Notes (Signed)
MD called back regarding pt's chest pain and placed order to start Nitro drip

## 2017-11-22 NOTE — Progress Notes (Signed)
3 Days Post-Op Procedure(s) (LRB): LEFT HEART CATH AND CORONARY ANGIOGRAPHY (N/A) Subjective:  Had brief 5 min episode of chest pain last pm while straining on toilet. No further episodes.  Objective: Vital signs in last 24 hours: Temp:  [98 F (36.7 C)-98.2 F (36.8 C)] 98.1 F (36.7 C) (12/30 0806) Pulse Rate:  [63-78] 78 (12/30 0806) Cardiac Rhythm: Normal sinus rhythm (12/30 0845) Resp:  [15-18] 18 (12/30 0806) BP: (135-170)/(82-127) 135/85 (12/30 0806) SpO2:  [98 %] 98 % (12/30 0806) Weight:  [86 kg (189 lb 8 oz)] 86 kg (189 lb 8 oz) (12/30 0408)  Hemodynamic parameters for last 24 hours:    Intake/Output from previous day: 12/29 0701 - 12/30 0700 In: 573.7 [P.O.:240; I.V.:333.7] Out: 280 [Urine:280] Intake/Output this shift: Total I/O In: 120 [P.O.:120] Out: -   General appearance: alert and cooperative Heart: regular rate and rhythm, S1, S2 normal, no murmur, click, rub or gallop Lungs: clear to auscultation bilaterally  Lab Results: Recent Labs    11/21/17 0336 11/22/17 0349  WBC 5.9 6.9  HGB 16.8 16.2  HCT 48.3 47.4  PLT 177 163   BMET:  Recent Labs    11/20/17 0058  NA 132*  K 3.7  CL 101  CO2 23  GLUCOSE 97  BUN 11  CREATININE 0.91  CALCIUM 8.5*    PT/INR: No results for input(s): LABPROT, INR in the last 72 hours. ABG No results found for: PHART, HCO3, TCO2, ACIDBASEDEF, O2SAT CBG (last 3)  No results for input(s): GLUCAP in the last 72 hours.  Assessment/Plan:  High grade ostial LAD stenosis. Plan CABG in am. He has no further questions.  LOS: 4 days    Alleen BorneBryan K Jonae Renshaw 11/22/2017

## 2017-11-22 NOTE — Progress Notes (Signed)
Pt had 8/10 chest pain. BP 170/127, after one nitro pain reduced to 3/10 BP 141/87, after 2nd nitro pain reduced to 0/10 BP 137/82. EKG done and MD notified

## 2017-11-22 NOTE — Progress Notes (Signed)
Pt complained of chest pain/pressure 5/10 in mid chest radiating to the right. BP 147/94. Gave one sublingual nitro. Pain is now 0/10. BP 126/81. Will continue to monitor.

## 2017-11-22 NOTE — Progress Notes (Signed)
Subjective:  he had recurrent chest pain last night while using the bathroom.rated a 7 out of 10 but eventually resolved.  No pain today.  Questions answered regarding surgery.  No bleeding on heparin.  Objective:  Vital Signs in the last 24 hours: BP 135/85 (BP Location: Right Arm)   Pulse 78   Temp 98.1 F (36.7 C) (Oral)   Resp 18   Ht 5\' 8"  (1.727 m)   Wt 86 kg (189 lb 8 oz)   SpO2 98%   BMI 28.81 kg/m   Physical Exam: Pleasant male sitting in chair in no acute distress Lungs:  Clear Cardiac:  Regular rhythm, normal S1 and S2, no S3 Extremities:  No edema present, radial catheterization site clean and dry  Intake/Output from previous day: 12/29 0701 - 12/30 0700 In: 573.7 [P.O.:240; I.V.:333.7] Out: 280 [Urine:280]  Weight Filed Weights   11/20/17 0645 11/21/17 0547 11/22/17 0408  Weight: 86.7 kg (191 lb 1.6 oz) 85.7 kg (189 lb) 86 kg (189 lb 8 oz)    Lab Results: Basic Metabolic Panel: Recent Labs    11/20/17 0058  NA 132*  K 3.7  CL 101  CO2 23  GLUCOSE 97  BUN 11  CREATININE 0.91   CBC: Recent Labs    11/21/17 0336 11/22/17 0349  WBC 5.9 6.9  HGB 16.8 16.2  HCT 48.3 47.4  MCV 96.6 96.9  PLT 177 163    Telemetry: Sinus rhythm, personally reviewed  Assessment/Plan:  1.  Unstable angina with ostial LAD lesion awaiting bypass grafting-recurrent pain overnight.  Recommendations:  I'm going to start him on some low-dose nitrate to keep thingsettled down.  Would prefer him not to walk in the hall today.     Darden PalmerW. Spencer Tilley, Jr.  MD [facility] Cardiology  11/22/2017, 10:15 AM

## 2017-11-23 ENCOUNTER — Inpatient Hospital Stay (HOSPITAL_COMMUNITY): Payer: Self-pay | Admitting: Certified Registered Nurse Anesthetist

## 2017-11-23 ENCOUNTER — Inpatient Hospital Stay (HOSPITAL_COMMUNITY): Payer: Self-pay

## 2017-11-23 ENCOUNTER — Inpatient Hospital Stay (HOSPITAL_COMMUNITY)
Admission: AD | Disposition: A | Discharge status: Home / Self Care | Payer: Self-pay | Source: Other Acute Inpatient Hospital | Attending: Surgery

## 2017-11-23 ENCOUNTER — Inpatient Hospital Stay (HOSPITAL_COMMUNITY)
Admission: AD | Admit: 2017-11-23 | Discharge: 2017-11-23 | Disposition: A | Discharge status: Home / Self Care | Payer: Self-pay | Source: Other Acute Inpatient Hospital | Attending: Surgery | Admitting: Surgery

## 2017-11-23 DIAGNOSIS — I25110 Atherosclerotic heart disease of native coronary artery with unstable angina pectoris: Secondary | ICD-10-CM

## 2017-11-23 DIAGNOSIS — Z951 Presence of aortocoronary bypass graft: Secondary | ICD-10-CM

## 2017-11-23 HISTORY — PX: TEE WITHOUT CARDIOVERSION: SHX5443

## 2017-11-23 HISTORY — PX: CORONARY ARTERY BYPASS GRAFT: SHX141

## 2017-11-23 LAB — CBC
HCT: 43.7 % (ref 39.0–52.0)
HEMATOCRIT: 47.6 % (ref 39.0–52.0)
HEMATOCRIT: 35.8 % — AB (ref 39.0–52.0)
HEMOGLOBIN: 16.5 g/dL (ref 13.0–17.0)
Hemoglobin: 14.9 g/dL (ref 13.0–17.0)
Hemoglobin: 12.4 g/dL — ABNORMAL LOW (ref 13.0–17.0)
MCH: 33.6 pg (ref 26.0–34.0)
MCH: 33.2 pg (ref 26.0–34.0)
MCH: 33.9 pg (ref 26.0–34.0)
MCHC: 34.7 g/dL (ref 30.0–36.0)
MCHC: 34.1 g/dL (ref 30.0–36.0)
MCHC: 34.6 g/dL (ref 30.0–36.0)
MCV: 96.9 fL (ref 78.0–100.0)
MCV: 97.3 fL (ref 78.0–100.0)
MCV: 97.8 fL (ref 78.0–100.0)
PLATELETS: 92 10*3/uL — AB (ref 150–400)
Platelets: 173 10*3/uL (ref 150–400)
Platelets: 107 10*3/uL — ABNORMAL LOW (ref 150–400)
RBC: 4.91 MIL/uL (ref 4.22–5.81)
RBC: 4.49 MIL/uL (ref 4.22–5.81)
RBC: 3.66 MIL/uL — AB (ref 4.22–5.81)
RDW: 13.4 % (ref 11.5–15.5)
RDW: 13.2 % (ref 11.5–15.5)
RDW: 13.3 % (ref 11.5–15.5)
WBC: 6.9 10*3/uL (ref 4.0–10.5)
WBC: 10 10*3/uL (ref 4.0–10.5)
WBC: 7.5 10*3/uL (ref 4.0–10.5)

## 2017-11-23 LAB — POCT I-STAT 3, ART BLOOD GAS (G3+)
ACID-BASE DEFICIT: 3 mmol/L — AB (ref 0.0–2.0)
ACID-BASE DEFICIT: 4 mmol/L — AB (ref 0.0–2.0)
ACID-BASE DEFICIT: 6 mmol/L — AB (ref 0.0–2.0)
ACID-BASE DEFICIT: 3 mmol/L — AB (ref 0.0–2.0)
Acid-Base Excess: 2 mmol/L (ref 0.0–2.0)
Acid-base deficit: 8 mmol/L — ABNORMAL HIGH (ref 0.0–2.0)
Acid-base deficit: 3 mmol/L — ABNORMAL HIGH (ref 0.0–2.0)
BICARBONATE: 23.2 mmol/L (ref 20.0–28.0)
BICARBONATE: 22.5 mmol/L (ref 20.0–28.0)
Bicarbonate: 26.9 mmol/L (ref 20.0–28.0)
Bicarbonate: 21 mmol/L (ref 20.0–28.0)
Bicarbonate: 20.9 mmol/L (ref 20.0–28.0)
Bicarbonate: 20.3 mmol/L (ref 20.0–28.0)
Bicarbonate: 23.7 mmol/L (ref 20.0–28.0)
O2 SAT: 100 %
O2 SAT: 93 %
O2 Saturation: 100 %
O2 Saturation: 98 %
O2 Saturation: 97 %
O2 Saturation: 99 %
O2 Saturation: 98 %
PCO2 ART: 43.1 mmHg (ref 32.0–48.0)
PCO2 ART: 50.2 mmHg — AB (ref 32.0–48.0)
PCO2 ART: 44.3 mmHg (ref 32.0–48.0)
PH ART: 7.391 (ref 7.350–7.450)
PH ART: 7.214 — AB (ref 7.350–7.450)
PH ART: 7.344 — AB (ref 7.350–7.450)
PH ART: 7.332 — AB (ref 7.350–7.450)
PO2 ART: 71 mmHg — AB (ref 83.0–108.0)
Patient temperature: 36.5
TCO2: 24 mmol/L (ref 22–32)
TCO2: 28 mmol/L (ref 22–32)
TCO2: 22 mmol/L (ref 22–32)
TCO2: 22 mmol/L (ref 22–32)
TCO2: 22 mmol/L (ref 22–32)
TCO2: 24 mmol/L (ref 22–32)
TCO2: 25 mmol/L (ref 22–32)
pCO2 arterial: 42.8 mmHg (ref 32.0–48.0)
pCO2 arterial: 44.4 mmHg (ref 32.0–48.0)
pCO2 arterial: 37.1 mmHg (ref 32.0–48.0)
pCO2 arterial: 40.7 mmHg (ref 32.0–48.0)
pH, Arterial: 7.342 — ABNORMAL LOW (ref 7.350–7.450)
pH, Arterial: 7.356 (ref 7.350–7.450)
pH, Arterial: 7.291 — ABNORMAL LOW (ref 7.350–7.450)
pO2, Arterial: 410 mmHg — ABNORMAL HIGH (ref 83.0–108.0)
pO2, Arterial: 549 mmHg — ABNORMAL HIGH (ref 83.0–108.0)
pO2, Arterial: 106 mmHg (ref 83.0–108.0)
pO2, Arterial: 116 mmHg — ABNORMAL HIGH (ref 83.0–108.0)
pO2, Arterial: 123 mmHg — ABNORMAL HIGH (ref 83.0–108.0)
pO2, Arterial: 107 mmHg (ref 83.0–108.0)

## 2017-11-23 LAB — CREATININE, SERUM
CREATININE: 1.12 mg/dL (ref 0.61–1.24)
GFR calc Af Amer: >60 mL/min (ref >60)
GFR calc non Af Amer: >60 mL/min (ref >60)

## 2017-11-23 LAB — POCT I-STAT, CHEM 8
BUN: 12 mg/dL (ref 6–20)
BUN: 11 mg/dL (ref 6–20)
BUN: 11 mg/dL (ref 6–20)
BUN: 12 mg/dL (ref 6–20)
BUN: 13 mg/dL (ref 6–20)
BUN: 13 mg/dL (ref 6–20)
CALCIUM ION: 1.05 mmol/L — AB (ref 1.15–1.40)
CALCIUM ION: 0.98 mmol/L — AB (ref 1.15–1.40)
CHLORIDE: 101 mmol/L (ref 101–111)
CHLORIDE: 98 mmol/L — AB (ref 101–111)
CREATININE: 0.7 mg/dL (ref 0.61–1.24)
CREATININE: 0.6 mg/dL — AB (ref 0.61–1.24)
CREATININE: 0.7 mg/dL (ref 0.61–1.24)
Calcium, Ion: 1.28 mmol/L (ref 1.15–1.40)
Calcium, Ion: 1.26 mmol/L (ref 1.15–1.40)
Calcium, Ion: 1.07 mmol/L — ABNORMAL LOW (ref 1.15–1.40)
Calcium, Ion: 1.26 mmol/L (ref 1.15–1.40)
Chloride: 103 mmol/L (ref 101–111)
Chloride: 101 mmol/L (ref 101–111)
Chloride: 98 mmol/L — ABNORMAL LOW (ref 101–111)
Chloride: 104 mmol/L (ref 101–111)
Creatinine, Ser: 0.9 mg/dL (ref 0.61–1.24)
Creatinine, Ser: 0.9 mg/dL (ref 0.61–1.24)
Creatinine, Ser: 1 mg/dL (ref 0.61–1.24)
GLUCOSE: 107 mg/dL — AB (ref 65–99)
GLUCOSE: 100 mg/dL — AB (ref 65–99)
GLUCOSE: 91 mg/dL (ref 65–99)
GLUCOSE: 112 mg/dL — AB (ref 65–99)
GLUCOSE: 131 mg/dL — AB (ref 65–99)
Glucose, Bld: 129 mg/dL — ABNORMAL HIGH (ref 65–99)
HCT: 44 % (ref 39.0–52.0)
HCT: 36 % — ABNORMAL LOW (ref 39.0–52.0)
HCT: 34 % — ABNORMAL LOW (ref 39.0–52.0)
HCT: 33 % — ABNORMAL LOW (ref 39.0–52.0)
HEMATOCRIT: 48 % (ref 39.0–52.0)
HEMATOCRIT: 37 % — AB (ref 39.0–52.0)
HEMOGLOBIN: 12.2 g/dL — AB (ref 13.0–17.0)
HEMOGLOBIN: 11.6 g/dL — AB (ref 13.0–17.0)
HEMOGLOBIN: 11.2 g/dL — AB (ref 13.0–17.0)
HEMOGLOBIN: 12.6 g/dL — AB (ref 13.0–17.0)
Hemoglobin: 16.3 g/dL (ref 13.0–17.0)
Hemoglobin: 15 g/dL (ref 13.0–17.0)
POTASSIUM: 3.8 mmol/L (ref 3.5–5.1)
POTASSIUM: 3.8 mmol/L (ref 3.5–5.1)
POTASSIUM: 4.4 mmol/L (ref 3.5–5.1)
POTASSIUM: 3.8 mmol/L (ref 3.5–5.1)
Potassium: 5.9 mmol/L — ABNORMAL HIGH (ref 3.5–5.1)
Potassium: 4.7 mmol/L (ref 3.5–5.1)
SODIUM: 139 mmol/L (ref 135–145)
Sodium: 136 mmol/L (ref 135–145)
Sodium: 137 mmol/L (ref 135–145)
Sodium: 139 mmol/L (ref 135–145)
Sodium: 133 mmol/L — ABNORMAL LOW (ref 135–145)
Sodium: 134 mmol/L — ABNORMAL LOW (ref 135–145)
TCO2: 25 mmol/L (ref 22–32)
TCO2: 26 mmol/L (ref 22–32)
TCO2: 23 mmol/L (ref 22–32)
TCO2: 23 mmol/L (ref 22–32)
TCO2: 28 mmol/L (ref 22–32)
TCO2: 22 mmol/L (ref 22–32)

## 2017-11-23 LAB — GLUCOSE, CAPILLARY
GLUCOSE-CAPILLARY: 119 mg/dL — AB (ref 65–99)
GLUCOSE-CAPILLARY: 122 mg/dL — AB (ref 65–99)
GLUCOSE-CAPILLARY: 127 mg/dL — AB (ref 65–99)
GLUCOSE-CAPILLARY: 132 mg/dL — AB (ref 65–99)
GLUCOSE-CAPILLARY: 135 mg/dL — AB (ref 65–99)
GLUCOSE-CAPILLARY: 136 mg/dL — AB (ref 65–99)
Glucose-Capillary: 110 mg/dL — ABNORMAL HIGH (ref 65–99)
Glucose-Capillary: 125 mg/dL — ABNORMAL HIGH (ref 65–99)
Glucose-Capillary: 134 mg/dL — ABNORMAL HIGH (ref 65–99)
Glucose-Capillary: 164 mg/dL — ABNORMAL HIGH (ref 65–99)
Glucose-Capillary: 146 mg/dL — ABNORMAL HIGH (ref 65–99)

## 2017-11-23 LAB — BASIC METABOLIC PANEL
ANION GAP: 8 (ref 5–15)
BUN: 12 mg/dL (ref 6–20)
CALCIUM: 8.8 mg/dL — AB (ref 8.9–10.3)
CHLORIDE: 104 mmol/L (ref 101–111)
CO2: 21 mmol/L — AB (ref 22–32)
Creatinine, Ser: 0.8 mg/dL (ref 0.61–1.24)
GFR calc non Af Amer: >60 mL/min (ref >60)
Glucose, Bld: 105 mg/dL — ABNORMAL HIGH (ref 65–99)
Potassium: 3.6 mmol/L (ref 3.5–5.1)
SODIUM: 133 mmol/L — AB (ref 135–145)

## 2017-11-23 LAB — POCT I-STAT 4, (NA,K, GLUC, HGB,HCT)
GLUCOSE: 103 mg/dL — AB (ref 65–99)
HEMATOCRIT: 43 % (ref 39.0–52.0)
HEMOGLOBIN: 14.6 g/dL (ref 13.0–17.0)
Potassium: 4.2 mmol/L (ref 3.5–5.1)
Sodium: 136 mmol/L (ref 135–145)

## 2017-11-23 LAB — HEMOGLOBIN AND HEMATOCRIT, BLOOD
HCT: 35.1 % — ABNORMAL LOW (ref 39.0–52.0)
Hemoglobin: 12.1 g/dL — ABNORMAL LOW (ref 13.0–17.0)

## 2017-11-23 LAB — HEPARIN LEVEL (UNFRACTIONATED): Heparin Unfractionated: 0.48 IU/mL (ref 0.30–0.70)

## 2017-11-23 LAB — PROTIME-INR
INR: 1.26
Prothrombin Time: 15.7 seconds — ABNORMAL HIGH (ref 11.4–15.2)

## 2017-11-23 LAB — APTT: aPTT: 33 seconds (ref 24–36)

## 2017-11-23 LAB — PLATELET COUNT: Platelets: 131 10*3/uL — ABNORMAL LOW (ref 150–400)

## 2017-11-23 LAB — MAGNESIUM: MAGNESIUM: 3 mg/dL — AB (ref 1.7–2.4)

## 2017-11-23 SURGERY — CORONARY ARTERY BYPASS GRAFTING (CABG)
Anesthesia: General | Site: Chest

## 2017-11-23 MED ORDER — VANCOMYCIN HCL IN DEXTROSE 1-5 GM/200ML-% IV SOLN
1000.0000 mg | Freq: Once | INTRAVENOUS | Status: AC
  Administered 2017-11-23: 1000 mg via INTRAVENOUS
  Filled 2017-11-23: qty 200

## 2017-11-23 MED ORDER — HEMOSTATIC AGENTS (NO CHARGE) OPTIME
TOPICAL | Status: DC | PRN
  Administered 2017-11-23: 1 via TOPICAL

## 2017-11-23 MED ORDER — GLYCOPYRROLATE 0.2 MG/ML IJ SOLN
INTRAMUSCULAR | Status: DC | PRN
  Administered 2017-11-23 (×3): 0.2 mg via INTRAVENOUS

## 2017-11-23 MED ORDER — SODIUM CHLORIDE 0.9 % IV SOLN
250.0000 mL | INTRAVENOUS | Status: DC
  Administered 2017-11-24: 250 mL via INTRAVENOUS

## 2017-11-23 MED ORDER — SODIUM CHLORIDE 0.9 % IV SOLN
0.0000 ug/min | INTRAVENOUS | Status: DC
  Administered 2017-11-24: 10 ug/min via INTRAVENOUS
  Filled 2017-11-23: qty 20

## 2017-11-23 MED ORDER — HEPARIN SODIUM (PORCINE) 1000 UNIT/ML IJ SOLN
INTRAMUSCULAR | Status: DC | PRN
  Administered 2017-11-23: 30000 [IU] via INTRAVENOUS

## 2017-11-23 MED ORDER — THROMBIN 5000 UNITS EX SOLR
CUTANEOUS | Status: DC | PRN
  Administered 2017-11-23 (×3): 5000 [IU] via TOPICAL

## 2017-11-23 MED ORDER — BISACODYL 10 MG RE SUPP: 10.0000 mg | Freq: Every day | RECTAL | Status: DC

## 2017-11-23 MED ORDER — LACTATED RINGERS IV SOLN: INTRAVENOUS | Status: DC

## 2017-11-23 MED ORDER — ACETAMINOPHEN 650 MG RE SUPP
650.0000 mg | Freq: Once | RECTAL | Status: AC
  Administered 2017-11-23: 650 mg via RECTAL

## 2017-11-23 MED ORDER — ROCURONIUM BROMIDE 10 MG/ML (PF) SYRINGE
PREFILLED_SYRINGE | INTRAVENOUS | Status: DC | PRN
  Administered 2017-11-23: 20 mg via INTRAVENOUS
  Administered 2017-11-23: 50 mg via INTRAVENOUS
  Administered 2017-11-23: 30 mg via INTRAVENOUS
  Administered 2017-11-23: 50 mg via INTRAVENOUS
  Administered 2017-11-23: 30 mg via INTRAVENOUS

## 2017-11-23 MED ORDER — MORPHINE SULFATE (PF) 2 MG/ML IV SOLN: 1.0000 mg | INTRAVENOUS | Status: AC | PRN

## 2017-11-23 MED ORDER — SODIUM CHLORIDE 0.9 % IV SOLN
INTRAVENOUS | Status: DC | PRN
  Administered 2017-11-23: 15 mg/kg/h via INTRAVENOUS

## 2017-11-23 MED ORDER — SODIUM CHLORIDE 0.9% FLUSH: 3.0000 mL | INTRAVENOUS | Status: DC | PRN

## 2017-11-23 MED ORDER — SODIUM CHLORIDE 0.9% FLUSH
3.0000 mL | Freq: Two times a day (BID) | INTRAVENOUS | Status: DC
  Administered 2017-11-24 – 2017-11-25 (×3): 3 mL via INTRAVENOUS

## 2017-11-23 MED ORDER — MILRINONE LACTATE IN DEXTROSE 20-5 MG/100ML-% IV SOLN
INTRAVENOUS | Status: DC | PRN
  Administered 2017-11-23: .3 ug/kg/min via INTRAVENOUS

## 2017-11-23 MED ORDER — DOPAMINE-DEXTROSE 3.2-5 MG/ML-% IV SOLN: 0.0000 ug/kg/min | INTRAVENOUS | Status: AC

## 2017-11-23 MED ORDER — ONDANSETRON HCL 4 MG/2ML IJ SOLN: 4.0000 mg | Freq: Four times a day (QID) | INTRAMUSCULAR | Status: DC | PRN

## 2017-11-23 MED ORDER — NITROGLYCERIN IN D5W 200-5 MCG/ML-% IV SOLN: 0.0000 ug/min | INTRAVENOUS | Status: DC

## 2017-11-23 MED ORDER — METHYLPREDNISOLONE SODIUM SUCC 40 MG IJ SOLR
40.0000 mg | Freq: Once | INTRAMUSCULAR | Status: AC
  Administered 2017-11-23: 40 mg via INTRAVENOUS

## 2017-11-23 MED ORDER — MORPHINE SULFATE (PF) 2 MG/ML IV SOLN: 2.0000 mg | INTRAVENOUS | Status: DC | PRN

## 2017-11-23 MED ORDER — SODIUM CHLORIDE 0.9% FLUSH: 10.0000 mL | INTRAVENOUS | Status: DC | PRN

## 2017-11-23 MED ORDER — PANTOPRAZOLE SODIUM 40 MG PO TBEC
40.0000 mg | DELAYED_RELEASE_TABLET | Freq: Every day | ORAL | Status: DC
  Administered 2017-11-25: 40 mg via ORAL
  Filled 2017-11-23: qty 1

## 2017-11-23 MED ORDER — LACTATED RINGERS IV SOLN
INTRAVENOUS | Status: DC | PRN
  Administered 2017-11-23 (×2): via INTRAVENOUS

## 2017-11-23 MED ORDER — MIDAZOLAM HCL 10 MG/2ML IJ SOLN
INTRAMUSCULAR | Status: AC
Start: 2017-11-23 — End: 2017-11-23
  Filled 2017-11-23: qty 2

## 2017-11-23 MED ORDER — DOCUSATE SODIUM 100 MG PO CAPS
200.0000 mg | ORAL_CAPSULE | Freq: Every day | ORAL | Status: DC
  Administered 2017-11-24 – 2017-11-25 (×2): 200 mg via ORAL
  Filled 2017-11-23 (×2): qty 2

## 2017-11-23 MED ORDER — ASPIRIN EC 325 MG PO TBEC
325.0000 mg | DELAYED_RELEASE_TABLET | Freq: Every day | ORAL | Status: DC
  Administered 2017-11-24 – 2017-11-25 (×2): 325 mg via ORAL
  Filled 2017-11-23 (×2): qty 1

## 2017-11-23 MED ORDER — MIDAZOLAM HCL 5 MG/5ML IJ SOLN
INTRAMUSCULAR | Status: DC | PRN
  Administered 2017-11-23: 2 mg via INTRAVENOUS
  Administered 2017-11-23: 1 mg via INTRAVENOUS
  Administered 2017-11-23 (×2): 2 mg via INTRAVENOUS
  Administered 2017-11-23: 1 mg via INTRAVENOUS
  Administered 2017-11-23: 2 mg via INTRAVENOUS

## 2017-11-23 MED ORDER — DIPHENHYDRAMINE HCL 50 MG/ML IJ SOLN
25.0000 mg | Freq: Once | INTRAMUSCULAR | Status: AC
  Administered 2017-11-23: 25 mg via INTRAVENOUS

## 2017-11-23 MED ORDER — TRAMADOL HCL 50 MG PO TABS
50.0000 mg | ORAL_TABLET | ORAL | Status: DC | PRN
  Administered 2017-11-24: 100 mg via ORAL
  Filled 2017-11-23: qty 2

## 2017-11-23 MED ORDER — PROTAMINE SULFATE 10 MG/ML IV SOLN
INTRAVENOUS | Status: DC | PRN
  Administered 2017-11-23: 290 mg via INTRAVENOUS
  Administered 2017-11-23: 10 mg via INTRAVENOUS

## 2017-11-23 MED ORDER — ALBUMIN HUMAN 5 % IV SOLN
250.0000 mL | INTRAVENOUS | Status: AC | PRN
  Administered 2017-11-23 (×4): 250 mL via INTRAVENOUS
  Filled 2017-11-23 (×3): qty 250

## 2017-11-23 MED ORDER — CHLORHEXIDINE GLUCONATE CLOTH 2 % EX PADS
6.0000 | MEDICATED_PAD | Freq: Every day | CUTANEOUS | Status: DC
  Administered 2017-11-23: 6 via TOPICAL

## 2017-11-23 MED ORDER — CHLORHEXIDINE GLUCONATE 0.12% ORAL RINSE (MEDLINE KIT): 15.0000 mL | Freq: Two times a day (BID) | OROMUCOSAL | Status: DC

## 2017-11-23 MED ORDER — OXYCODONE HCL 5 MG PO TABS
5.0000 mg | ORAL_TABLET | ORAL | Status: DC | PRN
  Administered 2017-11-24 (×3): 10 mg via ORAL
  Filled 2017-11-23 (×4): qty 2

## 2017-11-23 MED ORDER — BISACODYL 5 MG PO TBEC
10.0000 mg | DELAYED_RELEASE_TABLET | Freq: Every day | ORAL | Status: DC
  Administered 2017-11-25: 10 mg via ORAL
  Filled 2017-11-23 (×2): qty 2

## 2017-11-23 MED ORDER — MILRINONE LACTATE IN DEXTROSE 20-5 MG/100ML-% IV SOLN
0.3000 ug/kg/min | INTRAVENOUS | Status: DC
  Administered 2017-11-23: 0.3 ug/kg/min via INTRAVENOUS
  Filled 2017-11-23: qty 100

## 2017-11-23 MED ORDER — ORAL CARE MOUTH RINSE
15.0000 mL | Freq: Four times a day (QID) | OROMUCOSAL | Status: DC
Start: 2017-11-23 — End: 2017-11-23
  Administered 2017-11-23: 15 mL via OROMUCOSAL

## 2017-11-23 MED ORDER — POTASSIUM CHLORIDE 10 MEQ/50ML IV SOLN: 10.0000 meq | INTRAVENOUS | Status: AC

## 2017-11-23 MED ORDER — THROMBIN (RECOMBINANT) 5000 UNITS EX SOLR
CUTANEOUS | Status: AC
  Filled 2017-11-23: qty 15000

## 2017-11-23 MED ORDER — PROPOFOL 10 MG/ML IV BOLUS
INTRAVENOUS | Status: DC | PRN
  Administered 2017-11-23: 100 mg via INTRAVENOUS

## 2017-11-23 MED ORDER — SODIUM CHLORIDE 0.45 % IV SOLN
INTRAVENOUS | Status: DC | PRN
  Administered 2017-11-23: 14:00:00 via INTRAVENOUS

## 2017-11-23 MED ORDER — PHENYLEPHRINE HCL 10 MG/ML IJ SOLN
INTRAVENOUS | Status: DC | PRN
  Administered 2017-11-23: 15 ug/min via INTRAVENOUS

## 2017-11-23 MED ORDER — GELATIN ABSORBABLE MT POWD
OROMUCOSAL | Status: DC | PRN
  Administered 2017-11-23 (×3): 5 mL via TOPICAL

## 2017-11-23 MED ORDER — FENTANYL CITRATE (PF) 250 MCG/5ML IJ SOLN
INTRAMUSCULAR | Status: DC | PRN
  Administered 2017-11-23: 100 ug via INTRAVENOUS
  Administered 2017-11-23: 200 ug via INTRAVENOUS
  Administered 2017-11-23: 150 ug via INTRAVENOUS
  Administered 2017-11-23: 100 ug via INTRAVENOUS
  Administered 2017-11-23: 50 ug via INTRAVENOUS
  Administered 2017-11-23 (×4): 100 ug via INTRAVENOUS
  Administered 2017-11-23 (×2): 75 ug via INTRAVENOUS
  Administered 2017-11-23: 100 ug via INTRAVENOUS

## 2017-11-23 MED ORDER — LACTATED RINGERS IV SOLN
INTRAVENOUS | Status: DC | PRN
  Administered 2017-11-23: 07:00:00 via INTRAVENOUS

## 2017-11-23 MED ORDER — FAMOTIDINE IN NACL 20-0.9 MG/50ML-% IV SOLN
20.0000 mg | Freq: Two times a day (BID) | INTRAVENOUS | Status: AC
  Administered 2017-11-23 – 2017-11-24 (×2): 20 mg via INTRAVENOUS
  Filled 2017-11-23: qty 50

## 2017-11-23 MED ORDER — METOPROLOL TARTRATE 5 MG/5ML IV SOLN: 2.5000 mg | INTRAVENOUS | Status: DC | PRN

## 2017-11-23 MED ORDER — VANCOMYCIN HCL 1000 MG IV SOLR
INTRAVENOUS | Status: DC | PRN
  Administered 2017-11-23: 1500 mg via INTRAVENOUS

## 2017-11-23 MED ORDER — DEXTROSE 5 % IV SOLN
INTRAVENOUS | Status: DC | PRN
  Administered 2017-11-23: .75 g via INTRAVENOUS
  Administered 2017-11-23: 1.5 g via INTRAVENOUS

## 2017-11-23 MED ORDER — ACETAMINOPHEN 160 MG/5ML PO SOLN: 650.0000 mg | Freq: Once | ORAL | Status: AC

## 2017-11-23 MED ORDER — INSULIN REGULAR BOLUS VIA INFUSION
0.0000 [IU] | Freq: Three times a day (TID) | INTRAVENOUS | Status: DC
  Filled 2017-11-23: qty 10

## 2017-11-23 MED ORDER — PROPOFOL 10 MG/ML IV BOLUS
INTRAVENOUS | Status: AC
  Filled 2017-11-23: qty 20

## 2017-11-23 MED ORDER — ORAL CARE MOUTH RINSE
15.0000 mL | OROMUCOSAL | Status: DC
  Administered 2017-11-23 – 2017-11-24 (×5): 15 mL via OROMUCOSAL

## 2017-11-23 MED ORDER — CHLORHEXIDINE GLUCONATE 0.12 % MT SOLN
15.0000 mL | OROMUCOSAL | Status: AC
  Administered 2017-11-23: 15 mL via OROMUCOSAL

## 2017-11-23 MED ORDER — CALCIUM CHLORIDE 10 % IV SOLN
1.0000 g | Freq: Once | INTRAVENOUS | Status: AC
  Administered 2017-11-23: 1 g via INTRAVENOUS

## 2017-11-23 MED ORDER — SODIUM CHLORIDE 0.9 % IV SOLN
INTRAVENOUS | Status: DC | PRN
  Administered 2017-11-23: .9 [IU]/h via INTRAVENOUS

## 2017-11-23 MED ORDER — MIDAZOLAM HCL 2 MG/2ML IJ SOLN
2.0000 mg | INTRAMUSCULAR | Status: DC | PRN
  Administered 2017-11-23 – 2017-11-24 (×4): 2 mg via INTRAVENOUS
  Filled 2017-11-23 (×4): qty 2

## 2017-11-23 MED ORDER — SODIUM CHLORIDE 0.9 % IV SOLN: INTRAVENOUS | Status: DC

## 2017-11-23 MED ORDER — SODIUM BICARBONATE 8.4 % IV SOLN
50.0000 meq | Freq: Once | INTRAVENOUS | Status: AC
  Administered 2017-11-23: 50 meq via INTRAVENOUS

## 2017-11-23 MED ORDER — MORPHINE SULFATE (PF) 4 MG/ML IV SOLN
2.0000 mg | INTRAVENOUS | Status: DC | PRN
  Administered 2017-11-23: 2 mg via INTRAVENOUS
  Administered 2017-11-23: 4 mg via INTRAVENOUS
  Administered 2017-11-23: 2 mg via INTRAVENOUS
  Administered 2017-11-24: 4 mg via INTRAVENOUS
  Filled 2017-11-23 (×3): qty 1

## 2017-11-23 MED ORDER — DEXMEDETOMIDINE HCL IN NACL 400 MCG/100ML IV SOLN
INTRAVENOUS | Status: DC | PRN
  Administered 2017-11-23: .3 ug/kg/h via INTRAVENOUS

## 2017-11-23 MED ORDER — FENTANYL CITRATE (PF) 250 MCG/5ML IJ SOLN
INTRAMUSCULAR | Status: AC
  Filled 2017-11-23: qty 25

## 2017-11-23 MED ORDER — METOPROLOL TARTRATE 25 MG/10 ML ORAL SUSPENSION: 12.5000 mg | Freq: Two times a day (BID) | ORAL | Status: DC

## 2017-11-23 MED ORDER — ASPIRIN 81 MG PO CHEW: 324.0000 mg | CHEWABLE_TABLET | Freq: Every day | ORAL | Status: DC

## 2017-11-23 MED ORDER — 0.9 % SODIUM CHLORIDE (POUR BTL) OPTIME
TOPICAL | Status: DC | PRN
  Administered 2017-11-23: 6000 mL
  Administered 2017-11-23: 1000 mL

## 2017-11-23 MED ORDER — ACETAMINOPHEN 160 MG/5ML PO SOLN
1000.0000 mg | Freq: Four times a day (QID) | ORAL | Status: DC
  Administered 2017-11-24 (×2): 1000 mg
  Filled 2017-11-23 (×2): qty 40.6

## 2017-11-23 MED ORDER — SODIUM CHLORIDE 0.9% FLUSH
10.0000 mL | Freq: Two times a day (BID) | INTRAVENOUS | Status: DC
  Administered 2017-11-24 – 2017-11-25 (×2): 10 mL

## 2017-11-23 MED ORDER — DEXTROSE 5 % IV SOLN
1.5000 g | Freq: Two times a day (BID) | INTRAVENOUS | Status: AC
  Administered 2017-11-23 – 2017-11-25 (×4): 1.5 g via INTRAVENOUS
  Filled 2017-11-23 (×4): qty 1.5

## 2017-11-23 MED ORDER — LEVALBUTEROL HCL 0.63 MG/3ML IN NEBU
INHALATION_SOLUTION | RESPIRATORY_TRACT | Status: AC
  Filled 2017-11-23: qty 3

## 2017-11-23 MED ORDER — LEVALBUTEROL HCL 0.63 MG/3ML IN NEBU
0.6300 mg | INHALATION_SOLUTION | Freq: Four times a day (QID) | RESPIRATORY_TRACT | Status: DC
  Administered 2017-11-23 – 2017-11-25 (×8): 0.63 mg via RESPIRATORY_TRACT
  Filled 2017-11-23 (×8): qty 3

## 2017-11-23 MED ORDER — EPINEPHRINE PF 1 MG/ML IJ SOLN
0.5000 ug/min | INTRAVENOUS | Status: DC
  Filled 2017-11-23: qty 4

## 2017-11-23 MED ORDER — SODIUM CHLORIDE 0.9 % IV SOLN
0.0000 ug/kg/h | INTRAVENOUS | Status: DC
  Administered 2017-11-23 (×2): 0.5 ug/kg/h via INTRAVENOUS
  Administered 2017-11-24: 0.7 ug/kg/h via INTRAVENOUS
  Administered 2017-11-24: 0.3 ug/kg/h via INTRAVENOUS
  Filled 2017-11-23 (×4): qty 2

## 2017-11-23 MED ORDER — MAGNESIUM SULFATE 4 GM/100ML IV SOLN
4.0000 g | Freq: Once | INTRAVENOUS | Status: AC
  Administered 2017-11-23: 4 g via INTRAVENOUS
  Filled 2017-11-23: qty 100

## 2017-11-23 MED ORDER — PLASMA-LYTE 148 IV SOLN
INTRAVENOUS | Status: AC | PRN
  Administered 2017-11-23: 500 mL via INTRAVENOUS

## 2017-11-23 MED ORDER — DOPAMINE-DEXTROSE 3.2-5 MG/ML-% IV SOLN
INTRAVENOUS | Status: DC | PRN
  Administered 2017-11-23: 3 ug/kg/min via INTRAVENOUS

## 2017-11-23 MED ORDER — LACTATED RINGERS IV SOLN
500.0000 mL | Freq: Once | INTRAVENOUS | Status: AC | PRN
  Administered 2017-11-23: 500 mL via INTRAVENOUS

## 2017-11-23 MED ORDER — METOPROLOL TARTRATE 12.5 MG HALF TABLET
12.5000 mg | ORAL_TABLET | Freq: Two times a day (BID) | ORAL | Status: DC
  Administered 2017-11-24: 12.5 mg via ORAL
  Filled 2017-11-23 (×2): qty 1

## 2017-11-23 MED ORDER — ACETAMINOPHEN 500 MG PO TABS
1000.0000 mg | ORAL_TABLET | Freq: Four times a day (QID) | ORAL | Status: DC
  Administered 2017-11-24 – 2017-11-25 (×5): 1000 mg via ORAL
  Filled 2017-11-23 (×5): qty 2

## 2017-11-23 SURGICAL SUPPLY — 105 items
BAG DECANTER FOR FLEXI CONT (MISCELLANEOUS) ×4 IMPLANT
BANDAGE ACE 4X5 VEL STRL LF (GAUZE/BANDAGES/DRESSINGS) ×4 IMPLANT
BANDAGE ACE 6X5 VEL STRL LF (GAUZE/BANDAGES/DRESSINGS) ×4 IMPLANT
BASKET HEART  (ORDER IN 25'S) (MISCELLANEOUS) ×1
BASKET HEART (ORDER IN 25'S) (MISCELLANEOUS) ×1
BASKET HEART (ORDER IN 25S) (MISCELLANEOUS) ×2 IMPLANT
BLADE STERNUM SYSTEM 6 (BLADE) ×4 IMPLANT
BNDG GAUZE ELAST 4 BULKY (GAUZE/BANDAGES/DRESSINGS) ×4 IMPLANT
CANISTER SUCT 3000ML PPV (MISCELLANEOUS) ×4 IMPLANT
CATH ROBINSON RED A/P 18FR (CATHETERS) ×8 IMPLANT
CATH THORACIC 28FR (CATHETERS) ×4 IMPLANT
CATH THORACIC 36FR (CATHETERS) ×4 IMPLANT
CATH THORACIC 36FR RT ANG (CATHETERS) ×4 IMPLANT
CLIP VESOCCLUDE MED 24/CT (CLIP) IMPLANT
CLIP VESOCCLUDE SM WIDE 24/CT (CLIP) ×4 IMPLANT
CRADLE DONUT ADULT HEAD (MISCELLANEOUS) ×4 IMPLANT
DRAPE CARDIOVASCULAR INCISE (DRAPES) ×2
DRAPE SLUSH/WARMER DISC (DRAPES) ×4 IMPLANT
DRAPE SRG 135X102X78XABS (DRAPES) ×2 IMPLANT
DRSG COVADERM 4X14 (GAUZE/BANDAGES/DRESSINGS) ×4 IMPLANT
DRSG COVADERM 4X8 (GAUZE/BANDAGES/DRESSINGS) ×4 IMPLANT
ELECT CAUTERY BLADE 6.4 (BLADE) ×4 IMPLANT
ELECT REM PT RETURN 9FT ADLT (ELECTROSURGICAL) ×8
ELECTRODE REM PT RTRN 9FT ADLT (ELECTROSURGICAL) ×4 IMPLANT
FELT TEFLON 1X6 (MISCELLANEOUS) ×4 IMPLANT
GAUZE SPONGE 4X4 12PLY STRL (GAUZE/BANDAGES/DRESSINGS) ×4 IMPLANT
GAUZE SPONGE 4X4 12PLY STRL LF (GAUZE/BANDAGES/DRESSINGS) ×4 IMPLANT
GLOVE BIO SURGEON STRL SZ 6 (GLOVE) ×8 IMPLANT
GLOVE BIO SURGEON STRL SZ 6.5 (GLOVE) ×6 IMPLANT
GLOVE BIO SURGEON STRL SZ7 (GLOVE) IMPLANT
GLOVE BIO SURGEON STRL SZ7.5 (GLOVE) ×12 IMPLANT
GLOVE BIO SURGEON STRL SZ8.5 (GLOVE) ×4 IMPLANT
GLOVE BIO SURGEONS STRL SZ 6.5 (GLOVE) ×2
GLOVE BIOGEL PI IND STRL 6 (GLOVE) ×12 IMPLANT
GLOVE BIOGEL PI IND STRL 6.5 (GLOVE) IMPLANT
GLOVE BIOGEL PI IND STRL 7.0 (GLOVE) ×8 IMPLANT
GLOVE BIOGEL PI IND STRL 8.5 (GLOVE) ×6 IMPLANT
GLOVE BIOGEL PI INDICATOR 6 (GLOVE) ×12
GLOVE BIOGEL PI INDICATOR 6.5 (GLOVE)
GLOVE BIOGEL PI INDICATOR 7.0 (GLOVE) ×8
GLOVE BIOGEL PI INDICATOR 8.5 (GLOVE) ×6
GLOVE EUDERMIC 7 POWDERFREE (GLOVE) ×12 IMPLANT
GLOVE INDICATOR 7.0 STRL GRN (GLOVE) ×12 IMPLANT
GLOVE ORTHO TXT STRL SZ7.5 (GLOVE) IMPLANT
GOWN STRL REUS W/ TWL LRG LVL3 (GOWN DISPOSABLE) ×16 IMPLANT
GOWN STRL REUS W/ TWL XL LVL3 (GOWN DISPOSABLE) ×4 IMPLANT
GOWN STRL REUS W/TWL LRG LVL3 (GOWN DISPOSABLE) ×16
GOWN STRL REUS W/TWL XL LVL3 (GOWN DISPOSABLE) ×4
HEMOSTAT POWDER SURGIFOAM 1G (HEMOSTASIS) ×12 IMPLANT
HEMOSTAT SURGICEL 2X14 (HEMOSTASIS) ×4 IMPLANT
INSERT FOGARTY 61MM (MISCELLANEOUS) IMPLANT
INSERT FOGARTY XLG (MISCELLANEOUS) ×4 IMPLANT
KIT BASIN OR (CUSTOM PROCEDURE TRAY) ×4 IMPLANT
KIT CATH CPB BARTLE (MISCELLANEOUS) ×4 IMPLANT
KIT ROOM TURNOVER OR (KITS) ×4 IMPLANT
KIT SUCTION CATH 14FR (SUCTIONS) ×4 IMPLANT
KIT VASOVIEW HEMOPRO VH 3000 (KITS) IMPLANT
NS IRRIG 1000ML POUR BTL (IV SOLUTION) ×20 IMPLANT
PACK E OPEN HEART (SUTURE) ×4 IMPLANT
PACK OPEN HEART (CUSTOM PROCEDURE TRAY) ×4 IMPLANT
PAD ARMBOARD 7.5X6 YLW CONV (MISCELLANEOUS) ×8 IMPLANT
PAD ELECT DEFIB RADIOL ZOLL (MISCELLANEOUS) ×4 IMPLANT
PENCIL BUTTON HOLSTER BLD 10FT (ELECTRODE) ×4 IMPLANT
PUNCH AORTIC ROTATE 4.0MM (MISCELLANEOUS) IMPLANT
PUNCH AORTIC ROTATE 4.5MM 8IN (MISCELLANEOUS) ×4 IMPLANT
PUNCH AORTIC ROTATE 5MM 8IN (MISCELLANEOUS) IMPLANT
SET CARDIOPLEGIA MPS 5001102 (MISCELLANEOUS) ×4 IMPLANT
SPONGE INTESTINAL PEANUT (DISPOSABLE) IMPLANT
SPONGE LAP 18X18 X RAY DECT (DISPOSABLE) ×4 IMPLANT
SPONGE LAP 4X18 X RAY DECT (DISPOSABLE) IMPLANT
SUT BONE WAX W31G (SUTURE) ×4 IMPLANT
SUT MNCRL AB 4-0 PS2 18 (SUTURE) IMPLANT
SUT PROLENE 3 0 SH DA (SUTURE) IMPLANT
SUT PROLENE 3 0 SH1 36 (SUTURE) ×4 IMPLANT
SUT PROLENE 4 0 RB 1 (SUTURE)
SUT PROLENE 4 0 SH DA (SUTURE) IMPLANT
SUT PROLENE 4-0 RB1 .5 CRCL 36 (SUTURE) IMPLANT
SUT PROLENE 5 0 C 1 36 (SUTURE) IMPLANT
SUT PROLENE 6 0 C 1 30 (SUTURE) IMPLANT
SUT PROLENE 7 0 BV 1 (SUTURE) IMPLANT
SUT PROLENE 7 0 BV1 MDA (SUTURE) ×4 IMPLANT
SUT PROLENE 8 0 BV175 6 (SUTURE) ×4 IMPLANT
SUT SILK  1 MH (SUTURE)
SUT SILK 1 MH (SUTURE) IMPLANT
SUT STEEL STERNAL CCS#1 18IN (SUTURE) IMPLANT
SUT STEEL SZ 6 DBL 3X14 BALL (SUTURE) IMPLANT
SUT VIC AB 1 CTX 36 (SUTURE) ×4
SUT VIC AB 1 CTX36XBRD ANBCTR (SUTURE) ×4 IMPLANT
SUT VIC AB 2-0 CT1 27 (SUTURE) ×4
SUT VIC AB 2-0 CT1 TAPERPNT 27 (SUTURE) ×4 IMPLANT
SUT VIC AB 2-0 CTX 27 (SUTURE) ×4 IMPLANT
SUT VIC AB 3-0 SH 27 (SUTURE)
SUT VIC AB 3-0 SH 27X BRD (SUTURE) IMPLANT
SUT VIC AB 3-0 X1 27 (SUTURE) ×4 IMPLANT
SUT VICRYL 4-0 PS2 18IN ABS (SUTURE) IMPLANT
SYSTEM SAHARA CHEST DRAIN ATS (WOUND CARE) ×4 IMPLANT
TAPE CLOTH SURG 4X10 WHT LF (GAUZE/BANDAGES/DRESSINGS) ×4 IMPLANT
TAPE PAPER 3X10 WHT MICROPORE (GAUZE/BANDAGES/DRESSINGS) ×4 IMPLANT
TOWEL GREEN STERILE (TOWEL DISPOSABLE) ×4 IMPLANT
TOWEL GREEN STERILE FF (TOWEL DISPOSABLE) IMPLANT
TRAY FOLEY SILVER 16FR TEMP (SET/KITS/TRAYS/PACK) ×4 IMPLANT
TUBING INSUFFLATION (TUBING) ×4 IMPLANT
UNDERPAD 30X30 (UNDERPADS AND DIAPERS) ×4 IMPLANT
WATER STERILE IRR 1000ML POUR (IV SOLUTION) ×8 IMPLANT
YANKAUER SUCT BULB TIP NO VENT (SUCTIONS) ×4 IMPLANT

## 2017-11-23 NOTE — Progress Notes (Signed)
Patient ID: [name], male   DOB: July 17, 1956, 61 y.o.   MRN: [id number] EVENING ROUNDS NOTE :     [address]       [place] [id number]             [phone]                 Day of Surgery Procedure(s) (LRB): CORONARY ARTERY BYPASS GRAFTING (CABG), times two using right saphaneous vein, and left internal mammary artery. (N/A) TRANSESOPHAGEAL ECHOCARDIOGRAM (TEE) (N/A)  Total Length of Stay:  LOS: 5 days  BP 105/76   Pulse 86   Temp (!) 97 F (36.1 C)   Resp 12   Ht 5\' 8"  (1.727 m)   Wt 188 lb 1.6 oz (85.3 kg)   SpO2 99%   BMI 28.60 kg/m   .Intake/Output      12/30 0701 - 12/31 0700 12/31 0701 - 01/01 0700   P.O. 120    I.V. (mL/kg) 176 (2.1) 2340.5 (27.4)   Blood  400   IV Piggyback  100   Total Intake(mL/kg) 296 (3.5) 2840.5 (33.3)   Urine (mL/kg/hr)  500 (0.7)   Blood  900   Chest Tube  70   Total Output  1470   Net +296 +1370.5        Urine Occurrence 1 x    Stool Occurrence 1 x      . sodium chloride 20 mL/hr at 11/23/17 1334  . [START ON 11/24/2017] sodium chloride    . sodium chloride 20 mL/hr at 11/23/17 1334  . albumin human    . cefUROXime (ZINACEF)  IV    . dexmedetomidine (PRECEDEX) IV infusion 0.5 mcg/kg/hr (11/23/17 1508)  . famotidine (PEPCID) IV Stopped (11/23/17 1338)  . insulin (NOVOLIN-R) infusion 0.4 Units/hr (11/23/17 1330)  . lactated ringers    . lactated ringers    . lactated ringers    . magnesium sulfate 4 g (11/23/17 1335)  . milrinone 0.3 mcg/kg/min (11/23/17 1300)  . nitroGLYCERIN    . phenylephrine (NEO-SYNEPHRINE) Adult infusion 5 mcg/min (11/23/17 1345)  . potassium chloride    . vancomycin       Lab Results  Component Value Date   WBC 10.0 11/23/2017   HGB 14.9 11/23/2017   HCT 43.7 11/23/2017   PLT 92 (L) 11/23/2017   GLUCOSE 131 (H) 11/23/2017   CHOL 179 11/19/2017   TRIG 117 11/19/2017   HDL 52 11/19/2017   LDLCALC 104 (H) 11/19/2017   NA 134 (L) 11/23/2017   K 4.4 11/23/2017   CL 98 (L)  11/23/2017   CREATININE 0.90 11/23/2017   BUN 13 11/23/2017   CO2 21 (L) 11/23/2017   INR 1.26 11/23/2017   HGBA1C 5.9 (H) 11/18/2017   Waking up , follows commands still on vent , to start weaning  Not bleeding On dopamine and milrinone    Delight OvensEdward B Deland Slocumb MD  Beeper [phone] Office [phone] 11/23/2017 3:36 PM

## 2017-11-23 NOTE — Transfer of Care (Signed)
Immediate Anesthesia Transfer of Care Note  Patient: Dale [name]  Procedure(s) Performed: CORONARY ARTERY BYPASS GRAFTING (CABG), times two using right saphaneous vein, and left internal mammary artery. (N/A Chest) TRANSESOPHAGEAL ECHOCARDIOGRAM (TEE) (N/A )  Patient Location: SICU  Anesthesia Type:General  Level of Consciousness: Patient remains intubated per anesthesia plan  Airway & Oxygen Therapy: Patient remains intubated per anesthesia plan  Post-op Assessment: Report given to RN and Post -op Vital signs reviewed and stable  Post vital signs: Reviewed and stable  Last Vitals:  Vitals:   11/23/17 0533 11/23/17 1301  BP: (!) 146/93 128/68  Pulse:  90  Resp:  12  Temp:    SpO2:  100%    Last Pain:  Vitals:   11/23/17 0430  TempSrc: Oral  PainSc:       Patients Stated Pain Goal: 0 (11/21/17 0854)  Complications: No apparent anesthesia complications

## 2017-11-23 NOTE — Plan of Care (Signed)
Per MD, waiting to wean until pt's rash clears up. Q2hr turns and mouth care with medline mouth rinse (NO chlorhexidine rinse).

## 2017-11-23 NOTE — Anesthesia Procedure Notes (Signed)
Central Venous Catheter Insertion Performed by: Eilene Ghaziose, Jannell Franta, MD, anesthesiologist Start/End12/31/2018 6:48 AM, 11/23/2017 7:02 AM Patient location: Pre-op. Preanesthetic checklist: patient identified, IV checked, site marked, risks and benefits discussed, surgical consent, monitors and equipment checked, pre-op evaluation, timeout performed and anesthesia consent Hand hygiene performed  and maximum sterile barriers used  Catheter size: 8.5 Fr PA cath was placed.Sheath introducer Swan type:thermodilution PA Cath depth:42 Procedure performed using ultrasound guided technique. Ultrasound Notes:anatomy identified, needle tip was noted to be adjacent to the nerve/plexus identified, no ultrasound evidence of intravascular and/or intraneural injection and image(s) printed for medical record Attempts: 1 Following insertion, line sutured, dressing applied and Biopatch. Post procedure assessment: blood return through all ports, free fluid flow and no air  Patient tolerated the procedure well with no immediate complications.

## 2017-11-23 NOTE — Progress Notes (Signed)
Vent changes made per Dr. Tyrone SageGerhardt verbal order.

## 2017-11-23 NOTE — Anesthesia Procedure Notes (Signed)
Procedure Name: Intubation Date/Time: 11/23/2017 7:45 AM Performed by: Clearnce Sorrel, CRNA Pre-anesthesia Checklist: Patient identified, Emergency Drugs available, Suction available, Patient being monitored and Timeout performed Patient Re-evaluated:Patient Re-evaluated prior to induction Oxygen Delivery Method: Circle system utilized Preoxygenation: Pre-oxygenation with 100% oxygen Induction Type: IV induction Ventilation: Mask ventilation without difficulty Laryngoscope Size: Mac and 3 Grade View: Grade I Tube size: 8.0 mm Number of attempts: 1 Airway Equipment and Method: Stylet Placement Confirmation: ETT inserted through vocal cords under direct vision,  positive ETCO2 and breath sounds checked- equal and bilateral Secured at: 23 cm Tube secured with: Tape Dental Injury: Teeth and Oropharynx as per pre-operative assessment

## 2017-11-23 NOTE — Progress Notes (Signed)
  Echocardiogram Echocardiogram Transesophageal has been performed.  Dale [name] 11/23/2017, 8:29 AM 

## 2017-11-23 NOTE — Progress Notes (Signed)
Cardiac wean started. RR4, 40%.

## 2017-11-23 NOTE — Anesthesia Procedure Notes (Signed)
Arterial Line Insertion Start/End12/31/2018 7:00 AM, 11/23/2017 7:00 AM Performed by: attending, CRNA  Patient location: Pre-op. Preanesthetic checklist: patient identified, IV checked, site marked, risks and benefits discussed, surgical consent, monitors and equipment checked, pre-op evaluation, timeout performed and anesthesia consent Lidocaine 1% used for infiltration Left, radial was placed Catheter size: 20 Fr Hand hygiene performed  and maximum sterile barriers used   Attempts: 1 Procedure performed without using ultrasound guided technique. Following insertion, dressing applied. Post procedure assessment: normal and unchanged

## 2017-11-23 NOTE — Progress Notes (Signed)
Changed A-line dressing and removed Biopatch because it contains CHG and patient had allergic reaction. Redressed without biopatch.

## 2017-11-23 NOTE — Significant Event (Signed)
Pt seems to have had anaphylactic reaction, CHG being the most likely culprit.  Pt was rewashed with soap and water only to remove any CHG residue.  Central line dressing was changed again and BIOPATCH removed.  Did Not use Chloraprep, but instead used alcohol wipes to cleans the area around the central line.  See Dr Dennie MaizesGerhardt's note regarding other orders for reaction treatment.    Dale [name], Dale [name]

## 2017-11-23 NOTE — Progress Notes (Signed)
Pt placed back on SIMV/PRVC/PS following ABG results.

## 2017-11-23 NOTE — Anesthesia Procedure Notes (Signed)
Anesthesia Procedure Image    

## 2017-11-23 NOTE — Progress Notes (Signed)
Patient ID: Dale ParisBilly [name], male   DOB: 01-23-1956, 61 y.o.   MRN: [id number] EVENING ROUNDS NOTE :     [address]       [name] [id number]             [phone]                 Day of Surgery Procedure(s) (LRB): CORONARY ARTERY BYPASS GRAFTING (CABG), times two using right saphaneous vein, and left internal mammary artery. (N/A) TRANSESOPHAGEAL ECHOCARDIOGRAM (TEE) (N/A)  Total Length of Stay:  LOS: 5 days  BP 94/71   Pulse 88   Temp 97.7 F (36.5 C)   Resp 16   Ht 5\' 8"  (1.727 m)   Wt 188 lb 1.6 oz (85.3 kg)   SpO2 96%   BMI 28.60 kg/m   .Intake/Output      12/30 0701 - 12/31 0700 12/31 0701 - 01/01 0700   P.O. 120    I.V. (mL/kg) 176 (2.1) 2419.2 (28.4)   Blood  400   IV Piggyback  350   Total Intake(mL/kg) 296 (3.5) 3169.2 (37.2)   Urine (mL/kg/hr)  500 (0.5)   Blood  900   Chest Tube  70   Total Output  1470   Net +296 +1699.2        Urine Occurrence 1 x    Stool Occurrence 1 x      . sodium chloride 20 mL/hr at 11/23/17 1334  . [START ON 11/24/2017] sodium chloride    . sodium chloride 20 mL/hr at 11/23/17 1334  . albumin human    . cefUROXime (ZINACEF)  IV    . dexmedetomidine (PRECEDEX) IV infusion 0.5 mcg/kg/hr (11/23/17 1508)  . epinephrine    . famotidine (PEPCID) IV Stopped (11/23/17 1338)  . insulin (NOVOLIN-R) infusion 0.4 Units/hr (11/23/17 1330)  . lactated ringers    . lactated ringers Stopped (11/23/17 1336)  . lactated ringers 20 mL/hr at 11/23/17 1336  . milrinone 0.3 mcg/kg/min (11/23/17 1744)  . nitroGLYCERIN    . phenylephrine (NEO-SYNEPHRINE) Adult infusion 5 mcg/min (11/23/17 1345)  . vancomycin       Lab Results  Component Value Date   WBC 10.0 11/23/2017   HGB 14.6 11/23/2017   HCT 43.0 11/23/2017   PLT 92 (L) 11/23/2017   GLUCOSE 103 (H) 11/23/2017   CHOL 179 11/19/2017   TRIG 117 11/19/2017   HDL 52 11/19/2017   LDLCALC 104 (H) 11/19/2017   NA 136 11/23/2017   K 4.2 11/23/2017   CL 98 (L) 11/23/2017    CREATININE 0.90 11/23/2017   BUN 13 11/23/2017   CO2 21 (L) 11/23/2017   INR 1.26 11/23/2017   HGBA1C 5.9 (H) 11/18/2017   About 2 hours after chmg bath and cleaning around right IJ line , patient became hypotensive with wheezing, rising pco2 . Has not gotten post op antibiotics yet. Vent rate increased with increased  tital volume. Developed red macro papular  rash  On legs and spread to chest . Given iv peps id, benadryl and steroids. Epi drip ready but not needed and BP increased and abgs improved .  Most likely agent  To precipitate anaphylactic reaction is chmg wash including around rt ij site. Will avoid all contact to chmg , right IJ dressing changed and cleaned with alcohol.  chest xray done no ptx lines in good position       Delight OvensEdward B Myles Mallicoat MD  Beeper [phone] Office [phone] 11/23/2017 6:46 PM

## 2017-11-23 NOTE — Progress Notes (Signed)
Cardiac wean changed to CPAP 5/PS 10.

## 2017-11-23 NOTE — Progress Notes (Signed)
Per Tyrone SageGerhardt, not to wean pt off ventilator until pt's rash is cleared. Respiratory notified of this.

## 2017-11-23 NOTE — Op Note (Signed)
CARDIOVASCULAR SURGERY OPERATIVE NOTE  11/23/2017  Surgeon:  Alleen BorneBryan K. Bartle, MD  First Assistant: Gershon CraneWayne Gold,  PA-C   Preoperative Diagnosis:  Severe single vessel coronary artery disease   Postoperative Diagnosis:  Same   Procedure:  1. Median Sternotomy 2. Extracorporeal circulation 3.   Coronary artery bypass grafting x 2   Left internal mammary graft to the mid LAD  SVG to distal LAD  4.   Open saphenous vein harvest from the right lower leg   Anesthesia:  General Endotracheal   Clinical History/Surgical Indication:  The patient is a 61 year old with hypertension and a 45 pk-yr smoking history who presented with a 3 week history of intermittent SSCP that progressed and became severe in the week prior to presentation. He described it as a crushing chest pain with associated shortness of breath, nausea and diaphoresis. He had a couple episodes at work and had to leave. Then on Christmas eve he had a severe episode and went to PCP and [facility]. He had a troponin of 0.5 and was transferred here. He has had no further CP since arrival here. Cath shows a 90% ostial LAD stenosis. LVEF was 50-55%. Echo showed the same LVEF with mild aortic root dilation at 38 mm. The aortic valve was normal. He has severe single vessel CAD with a 90% ostial LAD stenosis presenting with unstable angina and NSTEMI. I agree that CABG with a LIMA to the LAD is the best treatment for him. I discussed the operative procedure with the patient and family including alternatives, benefits and risks; including but not limited to bleeding, blood transfusion, infection, stroke, myocardial infarction, graft failure, heart block requiring a permanent pacemaker, organ dysfunction, and death.  Dale [name] understands and agrees to proceed.    Preparation:  The patient was seen in the  preoperative holding area and the correct patient, correct operation were confirmed with the patient after reviewing the medical record and catheterization. The consent was signed by me. Preoperative antibiotics were given. A pulmonary arterial line and radial arterial line were placed by the anesthesia team. The patient was taken back to the operating room and positioned supine on the operating room table. After being placed under general endotracheal anesthesia by the anesthesia team a foley catheter was placed. The neck, chest, abdomen, and both legs were prepped with betadine soap and solution and draped in the usual sterile manner. A surgical time-out was taken and the correct patient and operative procedure were confirmed with the nursing and anesthesia staff.   Cardiopulmonary Bypass:  A median sternotomy was performed. The pericardium was opened in the midline. Right ventricular function appeared normal. The ascending aorta was of normal size and had no palpable plaque. There were no contraindications to aortic cannulation or cross-clamping. The patient was fully systemically heparinized and the ACT was maintained > 400 sec. The proximal aortic arch was cannulated with a 20 F aortic cannula for arterial inflow. Venous cannulation was performed via the right atrial appendage using a two-staged venous cannula. An antegrade cardioplegia/vent cannula was inserted into the mid-ascending aorta. Aortic occlusion was performed with a single cross-clamp. Systemic cooling to 32 degrees Centigrade and topical cooling of the heart with iced saline were used. Hyperkalemic antegrade cold blood cardioplegia was used to induce diastolic arrest and was then given at about 20 minute intervals throughout the period of arrest to maintain myocardial temperature at or below 10 degrees centigrade. A temperature probe was  inserted into the interventricular septum and an insulating pad was placed in the pericardium.   Left  internal mammary harvest:  The left side of the sternum was retracted using the Rultract retractor. The left internal mammary artery was harvested as a pedicle graft. All side branches were clipped. It was a small-sized vessel of fair quality with a slightly thickened wall and fair blood flow. It was ligated distally and divided. It was sprayed with topical papaverine solution to prevent vasospasm.   Open saphenous vein harvest:  The right greater saphenous vein was harvested from the right lower leg through a longitudinal incision. It was a medium-sized vein of good quality. The side branches were all ligated with 4-0 silk ties.    Coronary arteries:  The coronary arteries were examined.   LAD:  Intramyocardial in its proximal and mid portions. I located the vessel in the mid portion and it was a large vessel with no disease. The distal vessel was visible on the surface of the heart and was a large vessel with no disease.  LCX:  No disease  RCA:  No disease   Grafts:  1. LIMA to the mid LAD: 2.5 mm. It was sewn end to side using 8-0 prolene continuous suture. 2. SVG to distal LAD:  2.0 mm. It was sewn end to side using 7-0 prolene continuous suture.   The proximal vein graft anastomosis was performed to the mid-ascending aorta using continuous 6-0 prolene suture. A graft marker was placed around the proximal anastomosis.   Completion:  The patient was rewarmed to 37 degrees Centigrade. The clamp was removed from the LIMA pedicle and there was slow warming of the septum and return of ventricular fibrillation. The crossclamp was removed with a time of 29 minutes. There was spontaneous return of sinus rhythm but there was some ST depression in the V lead and AV. When we weaned from bypass the anterior wall and septum were markedly hypokinetic. I was concerned that the flow from the LIMA graft was not adequate and decided that the best course of action was to place a vein graft to the  distal LAD. This was done as noted above. The distal and proximal anastomoses were checked for hemostasis. The position of the grafts was satisfactory. Two temporary epicardial pacing wires were placed on the right atrium and two on the right ventricle. The patient was weaned from CPB without difficulty on dopamine 3 and milrinone 0.3.  CPB time was 115 minutes. Cardiac output was 5 LPM. TEE showed good LV function with return of contractility in the anterior wall and septum. Heparin was fully reversed with protamine and the aortic and venous cannulas removed. Hemostasis was achieved. Mediastinal and left pleural drainage tubes were placed. The sternum was closed with double #6 stainless steel wires. The fascia was closed with continuous # 1 vicryl suture. The subcutaneous tissue was closed with 2-0 vicryl continuous suture. The skin was closed with 3-0 vicryl subcuticular suture. All sponge, needle, and instrument counts were reported correct at the end of the case. Dry sterile dressings were placed over the incisions and around the chest tubes which were connected to pleurevac suction. The patient was then transported to the surgical intensive care unit in critical but stable condition.

## 2017-11-24 ENCOUNTER — Inpatient Hospital Stay (HOSPITAL_COMMUNITY): Payer: Self-pay

## 2017-11-24 DIAGNOSIS — Z951 Presence of aortocoronary bypass graft: Secondary | ICD-10-CM

## 2017-11-24 LAB — COOXEMETRY PANEL
Carboxyhemoglobin: 1.3 % (ref 0.5–1.5)
Methemoglobin: 0.8 % (ref 0.0–1.5)
O2 Saturation: 77 %
Total hemoglobin: 10.9 g/dL — ABNORMAL LOW (ref 12.0–16.0)

## 2017-11-24 LAB — GLUCOSE, CAPILLARY
GLUCOSE-CAPILLARY: 106 mg/dL — AB (ref 65–99)
GLUCOSE-CAPILLARY: 111 mg/dL — AB (ref 65–99)
GLUCOSE-CAPILLARY: 112 mg/dL — AB (ref 65–99)
GLUCOSE-CAPILLARY: 123 mg/dL — AB (ref 65–99)
GLUCOSE-CAPILLARY: 129 mg/dL — AB (ref 65–99)
GLUCOSE-CAPILLARY: 134 mg/dL — AB (ref 65–99)
GLUCOSE-CAPILLARY: 136 mg/dL — AB (ref 65–99)
GLUCOSE-CAPILLARY: 137 mg/dL — AB (ref 65–99)
Glucose-Capillary: 131 mg/dL — ABNORMAL HIGH (ref 65–99)
Glucose-Capillary: 132 mg/dL — ABNORMAL HIGH (ref 65–99)
Glucose-Capillary: 120 mg/dL — ABNORMAL HIGH (ref 65–99)
Glucose-Capillary: 94 mg/dL (ref 65–99)
Glucose-Capillary: 113 mg/dL — ABNORMAL HIGH (ref 65–99)
Glucose-Capillary: 98 mg/dL (ref 65–99)
Glucose-Capillary: 89 mg/dL (ref 65–99)

## 2017-11-24 LAB — CBC
HCT: 37.5 % — ABNORMAL LOW (ref 39.0–52.0)
HCT: 34.4 % — ABNORMAL LOW (ref 39.0–52.0)
HEMOGLOBIN: 12 g/dL — AB (ref 13.0–17.0)
Hemoglobin: 12.9 g/dL — ABNORMAL LOW (ref 13.0–17.0)
MCH: 32.7 pg (ref 26.0–34.0)
MCH: 33.7 pg (ref 26.0–34.0)
MCHC: 34.4 g/dL (ref 30.0–36.0)
MCHC: 34.9 g/dL (ref 30.0–36.0)
MCV: 95.2 fL (ref 78.0–100.0)
MCV: 96.6 fL (ref 78.0–100.0)
Platelets: 113 10*3/uL — ABNORMAL LOW (ref 150–400)
Platelets: 115 10*3/uL — ABNORMAL LOW (ref 150–400)
RBC: 3.94 MIL/uL — ABNORMAL LOW (ref 4.22–5.81)
RBC: 3.56 MIL/uL — AB (ref 4.22–5.81)
RDW: 12.9 % (ref 11.5–15.5)
RDW: 13.5 % (ref 11.5–15.5)
WBC: 5.5 10*3/uL (ref 4.0–10.5)
WBC: 8.8 10*3/uL (ref 4.0–10.5)

## 2017-11-24 LAB — POCT I-STAT 3, ART BLOOD GAS (G3+)
ACID-BASE DEFICIT: 2 mmol/L (ref 0.0–2.0)
ACID-BASE DEFICIT: 4 mmol/L — AB (ref 0.0–2.0)
Bicarbonate: 22.5 mmol/L (ref 20.0–28.0)
Bicarbonate: 20.9 mmol/L (ref 20.0–28.0)
O2 SAT: 99 %
O2 SAT: 96 %
PH ART: 7.377 (ref 7.350–7.450)
TCO2: 24 mmol/L (ref 22–32)
TCO2: 22 mmol/L (ref 22–32)
pCO2 arterial: 38.3 mmHg (ref 32.0–48.0)
pCO2 arterial: 37.6 mmHg (ref 32.0–48.0)
pH, Arterial: 7.353 (ref 7.350–7.450)
pO2, Arterial: 125 mmHg — ABNORMAL HIGH (ref 83.0–108.0)
pO2, Arterial: 83 mmHg (ref 83.0–108.0)

## 2017-11-24 LAB — POCT I-STAT, CHEM 8
BUN: 16 mg/dL (ref 6–20)
CALCIUM ION: 1.25 mmol/L (ref 1.15–1.40)
CREATININE: 0.9 mg/dL (ref 0.61–1.24)
Chloride: 103 mmol/L (ref 101–111)
Glucose, Bld: 107 mg/dL — ABNORMAL HIGH (ref 65–99)
HCT: 35 % — ABNORMAL LOW (ref 39.0–52.0)
HEMOGLOBIN: 11.9 g/dL — AB (ref 13.0–17.0)
POTASSIUM: 3.7 mmol/L (ref 3.5–5.1)
Sodium: 139 mmol/L (ref 135–145)
TCO2: 22 mmol/L (ref 22–32)

## 2017-11-24 LAB — BASIC METABOLIC PANEL
Anion gap: 8 (ref 5–15)
BUN: 13 mg/dL (ref 6–20)
CALCIUM: 8.4 mg/dL — AB (ref 8.9–10.3)
CO2: 20 mmol/L — AB (ref 22–32)
Chloride: 107 mmol/L (ref 101–111)
Creatinine, Ser: 0.98 mg/dL (ref 0.61–1.24)
GFR calc Af Amer: >60 mL/min (ref >60)
GLUCOSE: 143 mg/dL — AB (ref 65–99)
Potassium: 3.3 mmol/L — ABNORMAL LOW (ref 3.5–5.1)
Sodium: 135 mmol/L (ref 135–145)

## 2017-11-24 LAB — MAGNESIUM
Magnesium: 2.4 mg/dL (ref 1.7–2.4)
Magnesium: 2.3 mg/dL (ref 1.7–2.4)

## 2017-11-24 LAB — CREATININE, SERUM
CREATININE: 1 mg/dL (ref 0.61–1.24)
GFR calc Af Amer: >60 mL/min (ref >60)
GFR calc non Af Amer: >60 mL/min (ref >60)

## 2017-11-24 LAB — CARBOXYHEMOGLOBIN - COOX: Carboxyhemoglobin: 1.2 % (ref 0.5–1.5)

## 2017-11-24 MED ORDER — POTASSIUM CHLORIDE 10 MEQ/50ML IV SOLN
10.0000 meq | INTRAVENOUS | Status: AC
  Administered 2017-11-24 (×3): 10 meq via INTRAVENOUS
  Filled 2017-11-24 (×3): qty 50

## 2017-11-24 MED ORDER — ORAL CARE MOUTH RINSE
15.0000 mL | Freq: Two times a day (BID) | OROMUCOSAL | Status: DC
  Administered 2017-11-26 – 2017-11-28 (×3): 15 mL via OROMUCOSAL

## 2017-11-24 MED ORDER — NICOTINE 7 MG/24HR TD PT24
7.0000 mg | MEDICATED_PATCH | Freq: Every day | TRANSDERMAL | Status: DC
  Administered 2017-11-24 – 2017-11-29 (×6): 7 mg via TRANSDERMAL
  Filled 2017-11-24 (×6): qty 1

## 2017-11-24 MED ORDER — INSULIN ASPART 100 UNIT/ML ~~LOC~~ SOLN
0.0000 [IU] | SUBCUTANEOUS | Status: DC
  Administered 2017-11-24 – 2017-11-25 (×2): 2 [IU] via SUBCUTANEOUS

## 2017-11-24 MED ORDER — POTASSIUM CHLORIDE 10 MEQ/50ML IV SOLN
10.0000 meq | INTRAVENOUS | Status: AC
  Administered 2017-11-24 (×3): 10 meq via INTRAVENOUS

## 2017-11-24 MED ORDER — ENOXAPARIN SODIUM 40 MG/0.4ML ~~LOC~~ SOLN
40.0000 mg | Freq: Every day | SUBCUTANEOUS | Status: DC
  Administered 2017-11-24: 40 mg via SUBCUTANEOUS
  Filled 2017-11-24: qty 0.4

## 2017-11-24 NOTE — Progress Notes (Signed)
Patient ID: Dale [name], male   DOB: [birth year], 62 y.o.   MRN: [id number] T[facility] DAILY ICU PROGRESS NOTE                   [address]            [name] [address]          [phone]   1 Day Post-Op Procedure(s) (LRB): CORONARY ARTERY BYPASS GRAFTING (CABG), times two using right saphaneous vein, and left internal mammary artery. (N/A) TRANSESOPHAGEAL ECHOCARDIOGRAM (TEE) (N/A)  Total Length of Stay:  LOS: 6 days   Subjective: Patient now extubated awake alert neurologically  Objective: Vital signs in last 24 hours: Temp:  [95.9 F (35.5 C)-98.6 F (37 C)] 98.6 F (37 C) (01/01 0700) Pulse Rate:  [69-96] 80 (01/01 0700) Cardiac Rhythm: Atrial paced (01/01 0400) Resp:  [2-34] 16 (01/01 0700) BP: (87-128)/(48-85) 90/62 (01/01 0700) SpO2:  [93 %-100 %] 99 % (01/01 0700) Arterial Line BP: (66-179)/(44-98) 110/55 (01/01 0700) FiO2 (%):  [40 %-50 %] 40 % (01/01 0605) Weight:  [215 lb 9.8 oz (97.8 kg)] 215 lb 9.8 oz (97.8 kg) (01/01 0500)  Filed Weights   11/22/17 0408 11/23/17 0430 11/24/17 0500  Weight: 189 lb 8 oz (86 kg) 188 lb 1.6 oz (85.3 kg) 215 lb 9.8 oz (97.8 kg)    Weight change: 27 lb 8.2 oz (12.5 kg)   Hemodynamic parameters for last 24 hours: PAP: (11-31)/(6-17) 24/11 CO:  [4.7 L/min-5.5 L/min] 4.9 L/min CI:  [2.4 L/min/m2-2.8 L/min/m2] 2.5 L/min/m2  Intake/Output from previous day: 12/31 0701 - 01/01 0700 In: 5711.1 [I.V.:3701.1; Blood:400; IV Piggyback:1550] Out: 4195 [Urine:2635; Blood:900; Chest Tube:410]  Intake/Output this shift: No intake/output data recorded.  Current Meds: Scheduled Meds: . acetaminophen  1,000 mg Oral Q6H   Or  . acetaminophen (TYLENOL) oral liquid 160 mg/5 mL  1,000 mg Per Tube Q6H  . aspirin EC  325 mg Oral Daily   Or  . aspirin  324 mg Per Tube Daily  . atorvastatin  80 mg Oral q1800  . bisacodyl  10 mg Oral Daily   Or  . bisacodyl  10 mg Rectal Daily  . docusate sodium  200 mg Oral Daily  .  insulin regular  0-10 Units Intravenous TID WC  . levalbuterol  0.63 mg Nebulization Q6H  . mouth rinse  15 mL Mouth Rinse Q2H  . metoprolol tartrate  12.5 mg Oral BID   Or  . metoprolol tartrate  12.5 mg Per Tube BID  . [START ON 11/25/2017] pantoprazole  40 mg Oral Daily  . sodium chloride flush  10-40 mL Intracatheter Q12H  . sodium chloride flush  3 mL Intravenous Q12H   Continuous Infusions: . sodium chloride 20 mL/hr at 11/24/17 0600  . sodium chloride 250 mL (11/24/17 0620)  . sodium chloride 10 mL/hr at 11/24/17 0600  . cefUROXime (ZINACEF)  IV 1.5 g (11/24/17 0735)  . dexmedetomidine (PRECEDEX) IV infusion Stopped (11/24/17 0443)  . DOPamine 3.001 mcg/kg/min (11/24/17 0600)  . epinephrine    . insulin (NOVOLIN-R) infusion 1.2 Units/hr (11/24/17 0600)  . lactated ringers Stopped (11/23/17 1336)  . lactated ringers 20 mL/hr at 11/24/17 0600  . milrinone 0.3 mcg/kg/min (11/24/17 0600)  . nitroGLYCERIN Stopped (11/24/17 0320)  . phenylephrine (NEO-SYNEPHRINE) Adult infusion 25 mcg/min (11/24/17 0609)   PRN Meds:.sodium chloride, metoprolol tartrate, midazolam, morphine injection, ondansetron (ZOFRAN) IV, oxyCODONE, sodium chloride flush, sodium chloride flush, traMADol  General appearance: alert,  cooperative, appears stated age and no distress Neurologic: intact Heart: regular rate and rhythm, S1, S2 normal, no murmur, click, rub or gallop Lungs: diminished breath sounds bilaterally Abdomen: soft, non-tender; bowel sounds normal; no masses,  no organomegaly Extremities: extremities normal, atraumatic, no cyanosis or edema and Homans sign is negative, no sign of DVT Wound: Rashes completely resolved, sternum intact Patient has mild facial edema  Lab Results: CBC: Recent Labs    11/23/17 1828 11/23/17 1902 11/24/17 0255  WBC 7.5  --  5.5  HGB 12.4* 12.6* 12.9*  HCT 35.8* 37.0* 37.5*  PLT 107*  --  113*   BMET:  Recent Labs    11/23/17 0443  11/23/17 1902  11/24/17 0255  NA 133*   < > 139 135  K 3.6   < > 3.8 3.3*  CL 104   < > 104 107  CO2 21*  --   --  20*  GLUCOSE 105*   < > 129* 143*  BUN 12   < > 13 13  CREATININE 0.80   < > 1.00 0.98  CALCIUM 8.8*  --   --  8.4*   < > = values in this interval not displayed.    CMET: Lab Results  Component Value Date   WBC 5.5 11/24/2017   HGB 12.9 (L) 11/24/2017   HCT 37.5 (L) 11/24/2017   PLT 113 (L) 11/24/2017   GLUCOSE 143 (H) 11/24/2017   CHOL 179 11/19/2017   TRIG 117 11/19/2017   HDL 52 11/19/2017   LDLCALC 104 (H) 11/19/2017   NA 135 11/24/2017   K 3.3 (L) 11/24/2017   CL 107 11/24/2017   CREATININE 0.98 11/24/2017   BUN 13 11/24/2017   CO2 20 (L) 11/24/2017   INR 1.26 11/23/2017   HGBA1C 5.9 (H) 11/18/2017      PT/INR:  Recent Labs    11/23/17 1305  LABPROT 15.7*  INR 1.26   Radiology: Dg Chest Port 1 View  Result Date: 11/24/2017 CLINICAL DATA:  Postoperative EXAM: PORTABLE CHEST 1 VIEW COMPARISON:  Chest radiograph from one day prior. FINDINGS: Endotracheal tube tip is 4.7 cm above the carina. Enteric tube terminates in the proximal stomach. Right internal jugular Swan-Ganz catheter terminates in the right pulmonary artery. Intact sternotomy wires. Stable configuration of mediastinal drains. Stable cardiomediastinal silhouette with normal heart size. No pneumothorax. No pleural effusion. Stable mild bibasilar atelectasis. No pulmonary edema. IMPRESSION: 1. Well-positioned support structures.  No pneumothorax. 2. Stable mild bibasilar atelectasis. Electronically Signed   By: Delbert PhenixJason A Poff M.D.   On: 11/24/2017 07:04   Dg Chest Port 1 View  Result Date: 11/23/2017 CLINICAL DATA:  Post CABG. EXAM: PORTABLE CHEST 1 VIEW COMPARISON:  11/18/2017 FINDINGS: Endotracheal tube 6 cm from carina. Swan-Ganz catheter and mediastinal drains expected location. NG tube in stomach. Mild basilar atelectasis. Small LEFT effusion. No appreciable pneumothorax IMPRESSION: 1. Support apparatus  in expected location following CABG. 2. No appreciable pneumothorax. 3. Small LEFT effusion. Electronically Signed   By: Genevive BiStewart  Edmunds M.D.   On: 11/23/2017 13:52   Dg Chest Port 1v Same Day  Result Date: 11/23/2017 CLINICAL DATA:  Wheeze. EXAM: PORTABLE CHEST 1 VIEW COMPARISON:  11/23/2017 FINDINGS: Postoperative changes in the mediastinum. Endotracheal tube with tip measuring 6.8 cm above the carina. Enteric tube tip is not visualized off the field of view but is below the left hemidiaphragm. Three mediastinal drains. Right Swan-Ganz central venous catheter with tip in the pulmonary outflow tract. Heart size and  pulmonary vascularity are normal. Lungs are clear and expanded. No pneumothorax. No blunting of costophrenic angles. IMPRESSION: Appliances appear in satisfactory position. No evidence of active pulmonary disease. Electronically Signed   By: Burman Nieves M.D.   On: 11/23/2017 18:40     Assessment/Plan: S/P Procedure(s) (LRB): CORONARY ARTERY BYPASS GRAFTING (CABG), times two using right saphaneous vein, and left internal mammary artery. (N/A) TRANSESOPHAGEAL ECHOCARDIOGRAM (TEE) (N/A) Mobilize Diuresis Continue foley due to strict I&O, patient in ICU and urinary output monitoring See progression orders Expected Acute  Blood - loss Anemia- continue to monitor  Milrinone off Wean dopamine DC mediastinal tubes Most likely anaphylactic reaction to CHG washes and/or cleaning around right IJ site, chart marked as allergic and to avoid further contact with CHG. Patient stable this morning renal function intact  Delight Ovens 11/24/2017 8:01 AM

## 2017-11-24 NOTE — Progress Notes (Signed)
Patient ID: Dale ParisBilly Claflin, male   DOB: 02/09/1956, 62 y.o.   MRN: [id number] EVENING ROUNDS NOTE :     [address]       [place] [id number]             [phone]                 1 Day Post-Op Procedure(s) (LRB): CORONARY ARTERY BYPASS GRAFTING (CABG), times two using right saphaneous vein, and left internal mammary artery. (N/A) TRANSESOPHAGEAL ECHOCARDIOGRAM (TEE) (N/A)  Total Length of Stay:  LOS: 6 days  BP 110/71   Pulse 71   Temp 98.2 F (36.8 C) (Oral)   Resp 20   Ht 5\' 8"  (1.727 m)   Wt 215 lb 9.8 oz (97.8 kg)   SpO2 100%   BMI 32.78 kg/m   .Intake/Output      12/31 0701 - 01/01 0700 01/01 0701 - 01/02 0700   P.O.  1210   I.V. (mL/kg) 3783.1 (38.7) 391.5 (4)   Blood 400    Other 60    IV Piggyback 1550 200   Total Intake(mL/kg) 5793.1 (59.2) 1801.5 (18.4)   Urine (mL/kg/hr) 2635 (1.1) 590 (0.6)   Other 250    Blood 900    Chest Tube 410 70   Total Output 4195 660   Net +1598.1 +1141.5          . sodium chloride 20 mL/hr at 11/24/17 0800  . sodium chloride 250 mL (11/24/17 0800)  . sodium chloride 10 mL/hr at 11/24/17 0800  . cefUROXime (ZINACEF)  IV Stopped (11/24/17 0819)  . dexmedetomidine (PRECEDEX) IV infusion Stopped (11/24/17 0443)  . insulin (NOVOLIN-R) infusion Stopped (11/24/17 1129)  . lactated ringers Stopped (11/23/17 1336)  . lactated ringers 20 mL/hr at 11/24/17 1300  . milrinone Stopped (11/24/17 0820)  . nitroGLYCERIN Stopped (11/24/17 0320)  . phenylephrine (NEO-SYNEPHRINE) Adult infusion Stopped (11/24/17 1600)  . potassium chloride 10 mEq (11/24/17 1702)     Lab Results  Component Value Date   WBC 5.5 11/24/2017   HGB 11.9 (L) 11/24/2017   HCT 35.0 (L) 11/24/2017   PLT 113 (L) 11/24/2017   GLUCOSE 107 (H) 11/24/2017   CHOL 179 11/19/2017   TRIG 117 11/19/2017   HDL 52 11/19/2017   LDLCALC 104 (H) 11/19/2017   NA 139 11/24/2017   K 3.7 11/24/2017   CL 103 11/24/2017   CREATININE 0.90 11/24/2017   BUN 16  11/24/2017   CO2 20 (L) 11/24/2017   INR 1.26 11/23/2017   HGBA1C 5.9 (H) 11/18/2017   Stable day , dripps off   Delight OvensEdward B Ethen Bannan MD  Beeper [phone] Office [phone] 11/24/2017 5:34 PM

## 2017-11-24 NOTE — Progress Notes (Signed)
Respiratory notified readiness to wean. 

## 2017-11-24 NOTE — Procedures (Signed)
Extubation Procedure Note  Patient Details:   Name: Dale [name] DOB: 01-30-1956 MRN: [id number]   Airway Documentation:  Airway 8 mm (Active)  Secured at (cm) 23 cm 11/24/2017  4:34 AM  Measured From Lips 11/24/2017  4:34 AM  [place] 11/24/2017  4:34 AM  Secured By [employer] 11/24/2017  4:34 AM  Tube Holder Repositioned Yes 11/24/2017  4:34 AM  Site Condition Dry 11/24/2017  4:34 AM    Evaluation  O2 sats: stable throughout Complications: No apparent complications Patient did tolerate procedure well. Bilateral Breath Sounds: Clear, Diminished   Yes   Pulmonary mechanics done prior to extubation. VC 800, NIF -40, and weaning parameters met.    IS done following extubation 750cc with great pt effort and pt able to speak and placed on 4L nasal cannula.   Darryl Nestle F 11/24/2017, 6:46 AM

## 2017-11-24 NOTE — Progress Notes (Signed)
   Patient just extubated.  Tolerating well.  Rhythm stable.    Continue to monitor.   Corky CraftsVaranasi, Jayadeep S, MD

## 2017-11-25 ENCOUNTER — Encounter (HOSPITAL_COMMUNITY): Payer: Self-pay | Admitting: Surgery

## 2017-11-25 ENCOUNTER — Inpatient Hospital Stay (HOSPITAL_COMMUNITY): Payer: Self-pay

## 2017-11-25 LAB — CBC
HCT: 34.1 % — ABNORMAL LOW (ref 39.0–52.0)
Hemoglobin: 11.4 g/dL — ABNORMAL LOW (ref 13.0–17.0)
MCH: 32.9 pg (ref 26.0–34.0)
MCHC: 33.4 g/dL (ref 30.0–36.0)
MCV: 98.6 fL (ref 78.0–100.0)
Platelets: 103 10*3/uL — ABNORMAL LOW (ref 150–400)
RBC: 3.46 MIL/uL — ABNORMAL LOW (ref 4.22–5.81)
RDW: 13.7 % (ref 11.5–15.5)
WBC: 9.3 10*3/uL (ref 4.0–10.5)

## 2017-11-25 LAB — GLUCOSE, CAPILLARY
GLUCOSE-CAPILLARY: 102 mg/dL — AB (ref 65–99)
Glucose-Capillary: 139 mg/dL — ABNORMAL HIGH (ref 65–99)
Glucose-Capillary: 122 mg/dL — ABNORMAL HIGH (ref 65–99)

## 2017-11-25 LAB — BASIC METABOLIC PANEL
Anion gap: 5 (ref 5–15)
BUN: 15 mg/dL (ref 6–20)
CO2: 24 mmol/L (ref 22–32)
Calcium: 8.4 mg/dL — ABNORMAL LOW (ref 8.9–10.3)
Chloride: 105 mmol/L (ref 101–111)
Creatinine, Ser: 0.91 mg/dL (ref 0.61–1.24)
GFR calc Af Amer: >60 mL/min (ref >60)
GFR calc non Af Amer: >60 mL/min (ref >60)
Glucose, Bld: 125 mg/dL — ABNORMAL HIGH (ref 65–99)
Potassium: 3.9 mmol/L (ref 3.5–5.1)
Sodium: 134 mmol/L — ABNORMAL LOW (ref 135–145)

## 2017-11-25 LAB — COOXEMETRY PANEL
Carboxyhemoglobin: 1 % (ref 0.5–1.5)
Methemoglobin: 1.1 % (ref 0.0–1.5)
O2 Saturation: 66.8 %
Total hemoglobin: 11.5 g/dL — ABNORMAL LOW (ref 12.0–16.0)

## 2017-11-25 MED ORDER — METOPROLOL TARTRATE 25 MG/10 ML ORAL SUSPENSION: 25.0000 mg | Freq: Two times a day (BID) | ORAL | Status: DC

## 2017-11-25 MED ORDER — BISACODYL 10 MG RE SUPP: 10.0000 mg | Freq: Every day | RECTAL | Status: DC | PRN

## 2017-11-25 MED ORDER — SODIUM CHLORIDE 0.9% FLUSH: 3.0000 mL | INTRAVENOUS | Status: DC | PRN

## 2017-11-25 MED ORDER — AMIODARONE HCL IN DEXTROSE 360-4.14 MG/200ML-% IV SOLN
60.0000 mg/h | INTRAVENOUS | Status: DC
  Administered 2017-11-25 (×2): 60 mg/h via INTRAVENOUS

## 2017-11-25 MED ORDER — MOVING RIGHT ALONG BOOK
Freq: Once | Status: AC
  Administered 2017-11-25: 19:00:00
  Filled 2017-11-25: qty 1

## 2017-11-25 MED ORDER — TRAMADOL HCL 50 MG PO TABS
50.0000 mg | ORAL_TABLET | ORAL | Status: DC | PRN
  Administered 2017-11-26 (×2): 100 mg via ORAL
  Administered 2017-11-26 – 2017-11-27 (×2): 50 mg via ORAL
  Administered 2017-11-27 – 2017-11-28 (×2): 100 mg via ORAL
  Filled 2017-11-25 (×3): qty 2
  Filled 2017-11-25: qty 1
  Filled 2017-11-25 (×2): qty 2

## 2017-11-25 MED ORDER — SODIUM CHLORIDE 0.9% FLUSH
3.0000 mL | Freq: Two times a day (BID) | INTRAVENOUS | Status: DC
  Administered 2017-11-25 – 2017-11-28 (×6): 3 mL via INTRAVENOUS

## 2017-11-25 MED ORDER — METOPROLOL TARTRATE 25 MG PO TABS: 25.0000 mg | ORAL_TABLET | Freq: Two times a day (BID) | ORAL | Status: DC

## 2017-11-25 MED ORDER — AMIODARONE HCL IN DEXTROSE 360-4.14 MG/200ML-% IV SOLN
INTRAVENOUS | Status: AC
  Filled 2017-11-25: qty 400

## 2017-11-25 MED ORDER — AMIODARONE HCL IN DEXTROSE 360-4.14 MG/200ML-% IV SOLN
30.0000 mg/h | INTRAVENOUS | Status: DC
  Administered 2017-11-26: 30 mg/h via INTRAVENOUS
  Filled 2017-11-25: qty 200

## 2017-11-25 MED ORDER — ACETAMINOPHEN 325 MG PO TABS
650.0000 mg | ORAL_TABLET | Freq: Four times a day (QID) | ORAL | Status: DC | PRN
  Administered 2017-11-26: 650 mg via ORAL
  Filled 2017-11-25: qty 2

## 2017-11-25 MED ORDER — ONDANSETRON HCL 4 MG PO TABS: 4.0000 mg | ORAL_TABLET | Freq: Four times a day (QID) | ORAL | Status: DC | PRN

## 2017-11-25 MED ORDER — OXYCODONE HCL 5 MG PO TABS
5.0000 mg | ORAL_TABLET | ORAL | Status: DC | PRN
  Administered 2017-11-25 – 2017-11-28 (×2): 10 mg via ORAL
  Filled 2017-11-25 (×2): qty 2

## 2017-11-25 MED ORDER — KETOROLAC TROMETHAMINE 15 MG/ML IJ SOLN
15.0000 mg | Freq: Four times a day (QID) | INTRAMUSCULAR | Status: DC | PRN
  Administered 2017-11-25: 15 mg via INTRAVENOUS
  Filled 2017-11-25: qty 1

## 2017-11-25 MED ORDER — POTASSIUM CHLORIDE CRYS ER 20 MEQ PO TBCR
20.0000 meq | EXTENDED_RELEASE_TABLET | Freq: Two times a day (BID) | ORAL | Status: DC
  Administered 2017-11-25 – 2017-11-27 (×4): 20 meq via ORAL
  Filled 2017-11-25 (×4): qty 1

## 2017-11-25 MED ORDER — METOPROLOL TARTRATE 25 MG PO TABS
25.0000 mg | ORAL_TABLET | Freq: Two times a day (BID) | ORAL | Status: DC
  Administered 2017-11-25: 25 mg via ORAL
  Filled 2017-11-25: qty 1

## 2017-11-25 MED ORDER — METOPROLOL TARTRATE 25 MG PO TABS
37.5000 mg | ORAL_TABLET | Freq: Two times a day (BID) | ORAL | Status: DC
  Administered 2017-11-25 – 2017-11-27 (×4): 37.5 mg via ORAL
  Filled 2017-11-25 (×4): qty 1

## 2017-11-25 MED ORDER — ONDANSETRON HCL 4 MG/2ML IJ SOLN
4.0000 mg | Freq: Four times a day (QID) | INTRAMUSCULAR | Status: DC | PRN
  Administered 2017-11-25: 4 mg via INTRAVENOUS
  Filled 2017-11-25: qty 2

## 2017-11-25 MED ORDER — METOPROLOL TARTRATE 5 MG/5ML IV SOLN
5.0000 mg | Freq: Once | INTRAVENOUS | Status: AC
  Administered 2017-11-25: 5 mg via INTRAVENOUS

## 2017-11-25 MED ORDER — BISACODYL 5 MG PO TBEC: 10.0000 mg | DELAYED_RELEASE_TABLET | Freq: Every day | ORAL | Status: DC | PRN

## 2017-11-25 MED ORDER — METOPROLOL TARTRATE 5 MG/5ML IV SOLN
INTRAVENOUS | Status: AC
  Filled 2017-11-25: qty 5

## 2017-11-25 MED ORDER — FUROSEMIDE 40 MG PO TABS
40.0000 mg | ORAL_TABLET | Freq: Every day | ORAL | Status: DC
  Administered 2017-11-26 – 2017-11-27 (×2): 40 mg via ORAL
  Filled 2017-11-25 (×2): qty 1

## 2017-11-25 MED ORDER — POTASSIUM CHLORIDE CRYS ER 20 MEQ PO TBCR
40.0000 meq | EXTENDED_RELEASE_TABLET | Freq: Once | ORAL | Status: AC
  Administered 2017-11-25: 40 meq via ORAL
  Filled 2017-11-25: qty 2

## 2017-11-25 MED ORDER — SODIUM CHLORIDE 0.9 % IV SOLN: 250.0000 mL | INTRAVENOUS | Status: DC | PRN

## 2017-11-25 MED ORDER — ASPIRIN EC 325 MG PO TBEC
325.0000 mg | DELAYED_RELEASE_TABLET | Freq: Every day | ORAL | Status: DC
  Administered 2017-11-26 – 2017-11-29 (×4): 325 mg via ORAL
  Filled 2017-11-25 (×4): qty 1

## 2017-11-25 MED ORDER — AMIODARONE LOAD VIA INFUSION
150.0000 mg | Freq: Once | INTRAVENOUS | Status: AC
  Administered 2017-11-25: 150 mg via INTRAVENOUS

## 2017-11-25 MED ORDER — DOCUSATE SODIUM 100 MG PO CAPS
200.0000 mg | ORAL_CAPSULE | Freq: Every day | ORAL | Status: DC
  Administered 2017-11-26 – 2017-11-29 (×4): 200 mg via ORAL
  Filled 2017-11-25 (×4): qty 2

## 2017-11-25 MED ORDER — PANTOPRAZOLE SODIUM 40 MG PO TBEC
40.0000 mg | DELAYED_RELEASE_TABLET | Freq: Every day | ORAL | Status: DC
  Administered 2017-11-26 – 2017-11-29 (×4): 40 mg via ORAL
  Filled 2017-11-25 (×4): qty 1

## 2017-11-25 MED ORDER — FUROSEMIDE 10 MG/ML IJ SOLN
40.0000 mg | Freq: Once | INTRAMUSCULAR | Status: AC
  Administered 2017-11-25: 40 mg via INTRAVENOUS
  Filled 2017-11-25: qty 4

## 2017-11-25 MED FILL — Heparin Sodium (Porcine) Inj 1000 Unit/ML: INTRAMUSCULAR | Qty: 2500 | Status: AC

## 2017-11-25 MED FILL — Heparin Sodium (Porcine) Inj 1000 Unit/ML: INTRAMUSCULAR | Qty: 30 | Status: AC

## 2017-11-25 MED FILL — Potassium Chloride Inj 2 mEq/ML: INTRAVENOUS | Qty: 40 | Status: AC

## 2017-11-25 MED FILL — Magnesium Sulfate Inj 50%: INTRAMUSCULAR | Qty: 10 | Status: AC

## 2017-11-25 NOTE — Progress Notes (Signed)
Patient arrived from 2 heart to 4e21. Patient vital signs obtained and patient placed on monitor and verified with CCMD. Call bell with in reach will monitor patient. Samanth Mirkin, Randall AnKristin Jessup RN

## 2017-11-25 NOTE — Progress Notes (Signed)
2 Days Post-Op Procedure(s) (LRB): CORONARY ARTERY BYPASS GRAFTING (CABG), times two using right saphaneous vein, and left internal mammary artery. (N/A) TRANSESOPHAGEAL ECHOCARDIOGRAM (TEE) (N/A) Subjective:  Incisional soreness, says he did not sleep much  Objective: Vital signs in last 24 hours: Temp:  [98.2 F (36.8 C)-99.3 F (37.4 C)] 98.2 F (36.8 C) (01/02 0800) Pulse Rate:  [47-91] 81 (01/02 0800) Cardiac Rhythm: Normal sinus rhythm (01/02 0600) Resp:  [16-30] 27 (01/02 0800) BP: (94-153)/(60-89) 152/89 (01/02 0800) SpO2:  [93 %-100 %] 94 % (01/02 0800) Arterial Line BP: (100-135)/(54-70) 126/60 (01/01 1700) Weight:  [90.7 kg (200 lb)] 90.7 kg (200 lb) (01/02 0500)  Hemodynamic parameters for last 24 hours: PAP: (24-31)/(12-15) 30/13  Intake/Output from previous day: 01/01 0701 - 01/02 0700 In: 3171.5 [P.O.:2170; I.V.:651.5; IV Piggyback:350] Out: 1500 [Urine:1240; Chest Tube:260] Intake/Output this shift: No intake/output data recorded.  General appearance: alert and cooperative Neurologic: intact Heart: regular rate and rhythm, S1, S2 normal, no murmur, click, rub or gallop Lungs: clear to auscultation bilaterally Extremities: edema mild Wound: incisions ok  Lab Results: Recent Labs    11/24/17 1633 11/24/17 1639 11/25/17 0408  WBC 8.8  --  9.3  HGB 12.0* 11.9* 11.4*  HCT 34.4* 35.0* 34.1*  PLT 115*  --  103*   BMET:  Recent Labs    11/24/17 0255  11/24/17 1639 11/25/17 0408  NA 135  --  139 134*  K 3.3*  --  3.7 3.9  CL 107  --  103 105  CO2 20*  --   --  24  GLUCOSE 143*  --  107* 125*  BUN 13  --  16 15  CREATININE 0.98   < > 0.90 0.91  CALCIUM 8.4*  --   --  8.4*   < > = values in this interval not displayed.    PT/INR:  Recent Labs    11/23/17 1305  LABPROT 15.7*  INR 1.26   ABG    Component Value Date/Time   PHART 7.353 11/24/2017 0807   HCO3 20.9 11/24/2017 0807   TCO2 22 11/24/2017 1639   ACIDBASEDEF 4.0 (H) 11/24/2017  0807   O2SAT 66.8 11/25/2017 0422   CBG (last 3)  Recent Labs    11/24/17 1949 11/25/17 0404 11/25/17 0804  GLUCAP 89 139* 122*   CXR: ok  Assessment/Plan: S/P Procedure(s) (LRB): CORONARY ARTERY BYPASS GRAFTING (CABG), times two using right saphaneous vein, and left internal mammary artery. (N/A) TRANSESOPHAGEAL ECHOCARDIOGRAM (TEE) (N/A)  He is hemodynamically stable in sinus rhythm.  Volume excess: wt is 12 lbs over preop. Start diuretic.  DM: glucose under control. Preop Hgb A1c 5.9 so probably pre-diabetes and requires attention to diet and followup.  DC chest tube and sleeve.  Transfer to 4E.   LOS: 7 days    Alleen BorneBryan K Kisha Messman 11/25/2017

## 2017-11-25 NOTE — Progress Notes (Signed)
Transfer patient to 4E21 to nurse in room, pt in chair and stable.

## 2017-11-25 NOTE — Progress Notes (Signed)
Pt's foley catheter removed at 0530 per protocol - education provided and urinal given to pt.

## 2017-11-25 NOTE — Progress Notes (Signed)
Patient converted to atrial fib from NSR rate 130s-140s and pressure 189/107 and 179/113, Dale FudgeDonielle [name] [facility] made aware and orders received will continue to monitor patient. Diona Peregoy, Randall AnKristin Jessup RN

## 2017-11-25 NOTE — Anesthesia Postprocedure Evaluation (Signed)
Anesthesia Post Note  Patient: Conchita ParisBilly Ernsberger  Procedure(s) Performed: CORONARY ARTERY BYPASS GRAFTING (CABG), times two using right saphaneous vein, and left internal mammary artery. (N/A Chest) TRANSESOPHAGEAL ECHOCARDIOGRAM (TEE) (N/A )     Patient location during evaluation: SICU Anesthesia Type: General Level of consciousness: awake and alert Pain management: pain level controlled Vital Signs Assessment: post-procedure vital signs reviewed and stable Respiratory status: spontaneous breathing Cardiovascular status: stable Postop Assessment: no apparent nausea or vomiting Anesthetic complications: no Comments: No apparent issues. Patient comfortable. No gtts.     Last Vitals:  Vitals:   11/25/17 0500 11/25/17 0600  BP: 109/81 130/79  Pulse: 91 88  Resp: (!) 28 (!) 28  Temp:    SpO2: 95% 93%    Last Pain:  Vitals:   11/25/17 0403  TempSrc: Oral  PainSc:                  Shelton SilvasKevin D Ciji Boston

## 2017-11-26 ENCOUNTER — Inpatient Hospital Stay (HOSPITAL_COMMUNITY): Payer: Self-pay

## 2017-11-26 LAB — BASIC METABOLIC PANEL
Anion gap: 7 (ref 5–15)
BUN: 15 mg/dL (ref 6–20)
CHLORIDE: 101 mmol/L (ref 101–111)
CO2: 24 mmol/L (ref 22–32)
Calcium: 8.1 mg/dL — ABNORMAL LOW (ref 8.9–10.3)
Creatinine, Ser: 0.74 mg/dL (ref 0.61–1.24)
GFR calc Af Amer: >60 mL/min (ref >60)
GFR calc non Af Amer: >60 mL/min (ref >60)
GLUCOSE: 117 mg/dL — AB (ref 65–99)
Potassium: 4.2 mmol/L (ref 3.5–5.1)
Sodium: 132 mmol/L — ABNORMAL LOW (ref 135–145)

## 2017-11-26 LAB — CBC
HEMATOCRIT: 32.3 % — AB (ref 39.0–52.0)
Hemoglobin: 10.4 g/dL — ABNORMAL LOW (ref 13.0–17.0)
MCH: 31.2 pg (ref 26.0–34.0)
MCHC: 32.2 g/dL (ref 30.0–36.0)
MCV: 97 fL (ref 78.0–100.0)
Platelets: 114 10*3/uL — ABNORMAL LOW (ref 150–400)
RBC: 3.33 MIL/uL — ABNORMAL LOW (ref 4.22–5.81)
RDW: 13.4 % (ref 11.5–15.5)
WBC: 10.2 10*3/uL (ref 4.0–10.5)

## 2017-11-26 MED ORDER — AMIODARONE HCL 200 MG PO TABS
400.0000 mg | ORAL_TABLET | Freq: Two times a day (BID) | ORAL | Status: DC
  Administered 2017-11-26 – 2017-11-29 (×7): 400 mg via ORAL
  Filled 2017-11-26 (×7): qty 2

## 2017-11-26 MED ORDER — LISINOPRIL 10 MG PO TABS
10.0000 mg | ORAL_TABLET | Freq: Every day | ORAL | Status: DC
  Administered 2017-11-26: 10 mg via ORAL
  Filled 2017-11-26: qty 1

## 2017-11-26 NOTE — Progress Notes (Signed)
Pt ambulated about 600 feet in hall way with walker. At the end, pt became a bit short of breath, with some bilateral wheezes in lungs, although oxygen sats were at 95%.  Placed on 2L oxygen by nasal cannula for comfort.  Harriet Massonavidson, Kameryn Davern E, RN

## 2017-11-26 NOTE — Progress Notes (Signed)
CARDIAC REHAB PHASE I   PRE:  Rate/Rhythm: 57 SR  BP:  Supine:   Sitting: 136/90  Standing:    SaO2: 99%RA  MODE:  Ambulation: 470 ft   POST:  Rate/Rhythm: 75 SR  BP:  Supine:   Sitting: 129/95  Standing:    SaO2: 95%RA 1028-1054 Third walk for pt today. Walked 470 ft on RA with rolling walker independently. Tolerated well. Sats good on RA. Likes oxygen for comfort when in recliner. Remained in NSR.   Luetta Nuttingharlene Abbigaile Rockman, RN BSN  11/26/2017 10:50 AM

## 2017-11-26 NOTE — Progress Notes (Signed)
Patient ambulated in the hall 3 times on this shift, ambulation well tolerated will continue to monitor.

## 2017-11-26 NOTE — Progress Notes (Signed)
POD 3 CABG, 12 lbs over pre-op, conts to diureses, dc chest tubes on 1/2. Iv lasix transition to po, iv abx. Plan is home when stable. Patient has no insurance, will need PCP and medication asst before discharge.

## 2017-11-26 NOTE — Progress Notes (Addendum)
[address]       [place] [id number]             [phone]      3 Days Post-Op Procedure(s) (LRB): CORONARY ARTERY BYPASS GRAFTING (CABG), times two using right saphaneous vein, and left internal mammary artery. (N/A) TRANSESOPHAGEAL ECHOCARDIOGRAM (TEE) (N/A) Subjective: Afib, now on amio gtt and in sinus. + DOE  Objective: Vital signs in last 24 hours: Temp:  [97.6 F (36.4 C)-98.4 F (36.9 C)] 98.4 F (36.9 C) (01/03 0444) Pulse Rate:  [69-130] 69 (01/03 0444) Cardiac Rhythm: Normal sinus rhythm;Bundle branch block (01/02 1908) Resp:  [15-31] 27 (01/03 0444) BP: (115-189)/(78-113) 157/94 (01/03 0444) SpO2:  [91 %-100 %] 92 % (01/03 0444) Weight:  [199 lb 8 oz (90.5 kg)] 199 lb 8 oz (90.5 kg) (01/03 0500)  Hemodynamic parameters for last 24 hours:    Intake/Output from previous day: 01/02 0701 - 01/03 0700 In: 2305 [P.O.:1930; I.V.:260] Out: 120 [Urine:110; Chest Tube:10] Intake/Output this shift: No intake/output data recorded.  General appearance: alert, cooperative and no distress Heart: regular rate and rhythm Lungs: mildly dim in bases Abdomen: benign Extremities: min edema Wound: incis healing well, minor bloody drainage from CT site  Lab Results: Recent Labs    11/25/17 0408 11/26/17 0246  WBC 9.3 10.2  HGB 11.4* 10.4*  HCT 34.1* 32.3*  PLT 103* 114*   BMET:  Recent Labs    11/25/17 0408 11/26/17 0246  NA 134* 132*  K 3.9 4.2  CL 105 101  CO2 24 24  GLUCOSE 125* 117*  BUN 15 15  CREATININE 0.91 0.74  CALCIUM 8.4* 8.1*    PT/INR:  Recent Labs    11/23/17 1305  LABPROT 15.7*  INR 1.26   ABG    Component Value Date/Time   PHART 7.353 11/24/2017 0807   HCO3 20.9 11/24/2017 0807   TCO2 22 11/24/2017 1639   ACIDBASEDEF 4.0 (H) 11/24/2017 0807   O2SAT 66.8 11/25/2017 0422   CBG (last 3)  Recent Labs    11/24/17 2351 11/25/17 0404 11/25/17 0804  GLUCAP 102* 139* 122*    Meds Scheduled Meds: .  aspirin EC  325 mg Oral Daily  . atorvastatin  80 mg Oral q1800  . docusate sodium  200 mg Oral Daily  . furosemide  40 mg Oral Daily  . mouth rinse  15 mL Mouth Rinse BID  . metoprolol tartrate  37.5 mg Oral BID  . nicotine  7 mg Transdermal Daily  . pantoprazole  40 mg Oral QAC breakfast  . potassium chloride  20 mEq Oral BID  . sodium chloride flush  3 mL Intravenous Q12H   Continuous Infusions: . sodium chloride    . amiodarone 30 mg/hr (11/26/17 0546)   PRN Meds:.sodium chloride, acetaminophen, bisacodyl **OR** bisacodyl, ketorolac, ondansetron **OR** ondansetron (ZOFRAN) IV, oxyCODONE, sodium chloride flush, traMADol  Xrays Dg Chest Port 1 View  Result Date: 11/25/2017 CLINICAL DATA:  62 year old male with a history of chest tube status post cardiac surgery EXAM: PORTABLE CHEST 1 VIEW COMPARISON:  11/24/2017, 11/23/2017, 11/18/2017 FINDINGS: Cardiomediastinal silhouette unchanged in size and contour. Surgical changes of median sternotomy. Interval removal of the Swan-Ganz catheter, gastric tube, endotracheal tube. Right IJ sheath remains. Interval removal of mediastinal/ pleural drains, with 1 remaining projecting over the mediastinum. No pneumothorax. Low lung volumes with likely atelectasis.  No pleural effusion. IMPRESSION: Low lung volumes with likely atelectasis, status post extubation. Interval removal of  the Swan-Ganz catheter, gastric tube, and multiple pleural/ mediastinal drains. 1 drain remains projecting over the mediastinum. Right IJ sheath remains. Electronically Signed   By: [name] D.O.   On: 11/25/2017 07:28    Assessment/Plan: S/P Procedure(s) (LRB): CORONARY ARTERY BYPASS GRAFTING (CABG), times two using right saphaneous vein, and left internal mammary artery. (N/A) TRANSESOPHAGEAL ECHOCARDIOGRAM (TEE) (N/A)   1 cont amio gtt for afib, now in sinus rhythm 2 HTN, add ACE-I, creat is normal 3 H/H a litle lower, abl anemia- monitor 4 cont diuresis, not sure  of accuracy of weights or UO- creat normal, sodium a littlel low- monitor 5 sugars adeq controlled 6 CBG control pretty good, needs aggressive lifestyle/ nutrition modification for severe metabolic syndrome and insulin resistance. He seems pretty motivated   LOS: 8 days    Dale [name] 11/26/2017    Chart reviewed, patient examined, agree with above. He is back in sinus rhythm. Will switch to po amio so that he does not develop phlebitis from the IV amio. Continue Lopressor. Diuresis.

## 2017-11-27 MED ORDER — LEVALBUTEROL HCL 0.63 MG/3ML IN NEBU
0.6300 mg | INHALATION_SOLUTION | Freq: Three times a day (TID) | RESPIRATORY_TRACT | Status: DC
  Administered 2017-11-27 – 2017-11-28 (×3): 0.63 mg via RESPIRATORY_TRACT
  Filled 2017-11-27 (×3): qty 3

## 2017-11-27 MED ORDER — POTASSIUM CHLORIDE CRYS ER 20 MEQ PO TBCR
20.0000 meq | EXTENDED_RELEASE_TABLET | Freq: Three times a day (TID) | ORAL | Status: DC
  Administered 2017-11-27 – 2017-11-29 (×6): 20 meq via ORAL
  Filled 2017-11-27 (×6): qty 1

## 2017-11-27 MED ORDER — FUROSEMIDE 40 MG PO TABS
40.0000 mg | ORAL_TABLET | Freq: Two times a day (BID) | ORAL | Status: DC
  Administered 2017-11-27 – 2017-11-29 (×4): 40 mg via ORAL
  Filled 2017-11-27 (×5): qty 1

## 2017-11-27 MED ORDER — METOPROLOL TARTRATE 50 MG PO TABS
50.0000 mg | ORAL_TABLET | Freq: Two times a day (BID) | ORAL | Status: DC
  Administered 2017-11-27 – 2017-11-29 (×4): 50 mg via ORAL
  Filled 2017-11-27 (×4): qty 1

## 2017-11-27 MED ORDER — LISINOPRIL 10 MG PO TABS
20.0000 mg | ORAL_TABLET | Freq: Every day | ORAL | Status: DC
  Administered 2017-11-27: 20 mg via ORAL
  Filled 2017-11-27: qty 2

## 2017-11-27 MED FILL — Sodium Bicarbonate IV Soln 8.4%: INTRAVENOUS | Qty: 50 | Status: AC

## 2017-11-27 MED FILL — Mannitol IV Soln 20%: INTRAVENOUS | Qty: 500 | Status: AC

## 2017-11-27 MED FILL — Lidocaine HCl IV Inj 20 MG/ML: INTRAVENOUS | Qty: 10 | Status: AC

## 2017-11-27 MED FILL — Sodium Chloride IV Soln 0.9%: INTRAVENOUS | Qty: 2000 | Status: AC

## 2017-11-27 MED FILL — Electrolyte-R (PH 7.4) Solution: INTRAVENOUS | Qty: 3000 | Status: AC

## 2017-11-27 NOTE — Discharge Summary (Signed)
Physician Discharge Summary  Patient ID: Dale [name] MRN: [id number] DOB/AGE: 1956/07/22 62 y.o.  Admit date: 11/18/2017 Discharge date: 11/29/2017  Admission Diagnoses: Patient Active Problem List   Diagnosis Date Noted  . 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 Non-STEMI (non-ST elevated myocardial infarction) (HCC)   . Unstable angina (HCC) 11/18/2017    Discharge Diagnoses:  Active Problems:   Unstable angina (HCC)   Non-STEMI (non-ST elevated myocardial infarction) (HCC)   S/P CABG x 1   Discharged Condition: good  HPI:  The patient is a 62 year old with hypertension and a 45 pk-yr smoking history who presented with a 3 week history of intermittent SSCP that progressed and became severe in the week prior to presentation. He described it as a crushing chest pain with associated shortness of breath, nausea and diaphoresis. He had a couple episodes at work and had to leave. Then on Christmas eve he had a severe episode and went to PCP and [facility]. He had a troponin of 0.5 and was transferred here. He has had no further CP since arrival here. Cath shows a 90% ostial LAD stenosis. LVEF was 50-55%. Echo showed the same LVEF with mild aortic root dilation at 38 mm. The aortic valve was normal.   Hospital Course:  On 11/23/2017 Mr. Dale [name] underwent a coronary bypass grafting x2 Dr. Laneta SimmersBartle.  He tolerated the procedure well and was transferred to the surgical ICU.  He was extubated in a timely manner.  Approximately 2 hours after chmg bath and cleaning around the right IJ line, patient became hypotensive with wheezing and rising PCO2.  The patient developed a red maculopapular rash on his legs which spread to his chest.  No antibiotics have been given.  Epinephrine drip was prepared but not needed.  His blood pressure increased and ABGs improved.  This was most likely due to the contact of chmg which was avoided going forward.  Postop day 1 we began to mobilize the  patient.  He was hemodynamically stable off milrinone.  We continue to wean dopamine as tolerated.  We discontinued his mediastinal chest tubes.  We initiated a diuretic regimen for fluid overload.  Postop day 2 he remained hemodynamically stable with a normal sinus rhythm.  His blood glucose level remained controlled, however his A1c was elevated.  It was recommended that he seek outpatient care for his pre-diabetes.  The remainder of his chest tubes were removed at this time.  He was transferred to the telemetry unit for continued care.  On postop day 3 he went into atrial fibrillation and was started on amiodarone.  He converted to normal sinus rhythm.  We initiated an ACE inhibitor for hypertension.  We continued his diuretic regimen for fluid overload.  We also continued Lopressor for rhythm control.  Postop day 4 he was maintaining normal sinus rhythm.  His blood pressure still continued to be poorly controlled.  We were able to discontinue his epicardial pacing wires.  His blood glucose level remained adequately controlled.  Today, he was maintaining normal sinus rhythm, ambulating with limited assistance, his incisions are healing well, and he is ready for discharge.  Consults: None  Significant Diagnostic Studies:   CLINICAL DATA:  Shortness of breath. History of coronary artery disease and CABG, current smoker.  EXAM: CHEST  2 VIEW  COMPARISON:  Portable chest x-ray of November 25, 2017  FINDINGS: The lungs are adequately inflated. There is increased density at the right  lung base slightly more conspicuous today. There is a trace of blunting of the left lateral costophrenic angle which is stable. The cardiac silhouette is top-normal in size. The sternal wires are intact. The pulmonary vascularity is normal. The bony thorax exhibits no acute abnormality.  IMPRESSION: Increased density at the right lung base suggests subsegmental atelectasis. There is a trace of pleural fluid  bilaterally. No overt CHF.   Electronically Signed   By: [name] M.D.   On: 11/26/2017 09:32  Treatments:   CARDIOVASCULAR SURGERY OPERATIVE NOTE  11/23/2017  Surgeon:  Alleen Borne, MD  First Assistant: Gershon Crane,  PA-C   Preoperative Diagnosis:  Severe single vessel coronary artery disease   Postoperative Diagnosis:  Same   Procedure:  1. Median Sternotomy 2. Extracorporeal circulation 3.   Coronary artery bypass grafting x 2   Left internal mammary graft to the mid LAD  SVG to distal LAD  4.   Open saphenous vein harvest from the right lower leg   Anesthesia:  General Endotracheal    Discharge Exam: Blood pressure 133/84, pulse 60, temperature 98.6 F (37 C), temperature source Oral, resp. rate 17, height 5\' 8"  (1.727 m), weight 189 lb 9.5 oz (86 kg), SpO2 97 %.   General appearance: alert, cooperative and no distress Heart: regular rate and rhythm, S1, S2 normal, no murmur, click, rub or gallop Lungs: clear to auscultation bilaterally Abdomen: soft, non-tender; bowel sounds normal; no masses,  no organomegaly Extremities: extremities normal, atraumatic, no cyanosis or edema Wound: clean and dry    Disposition: Final discharge disposition not confirmed  Discharge Instructions    Amb Referral to Cardiac Rehabilitation   Complete by:  As directed    Referring to [facility] Phase 2   Diagnosis:  CABG   CABG X ___:  2     Allergies as of 11/29/2017      Reactions   Chlorhexidine    rash   Chantix [varenicline] Other (See Comments)   Nightmares      Medication List    STOP taking these medications   BC HEADACHE POWDER PO     TAKE these medications   acetaminophen 325 MG tablet Commonly known as:  TYLENOL Take 2 tablets (650 mg total) by mouth every 6 (six) hours as needed for mild pain.   amiodarone 200 MG tablet Commonly known as:  PACERONE Take one tab (200mg ) twice a day for 7 days then take one tab (200mg )  once a day.   amLODipine 5 MG tablet Commonly known as:  NORVASC Take 1 tablet (5 mg total) by mouth daily.   aspirin 325 MG EC tablet Take 1 tablet (325 mg total) by mouth daily.   atorvastatin 80 MG tablet Commonly known as:  LIPITOR Take 1 tablet (80 mg total) by mouth daily at 6 PM.   furosemide 40 MG tablet Commonly known as:  LASIX Take 1 tablet (40 mg total) by mouth daily for 7 days.   lisinopril 20 MG tablet Commonly known as:  PRINIVIL,ZESTRIL Take 20 mg by mouth 2 (two) times daily.   metoprolol tartrate 50 MG tablet Commonly known as:  LOPRESSOR Take 1 tablet (50 mg total) by mouth 2 (two) times daily.   nicotine 7 mg/24hr patch Commonly known as:  NICODERM CQ - dosed in mg/24 hr Place 1 patch (7 mg total) onto the skin daily.   oxyCODONE 5 MG immediate release tablet Commonly known as:  Oxy IR/ROXICODONE Take 1 tablet (5  mg total) by mouth every 4 (four) hours as needed for severe pain.   potassium chloride SA 20 MEQ tablet Commonly known as:  K-DUR,KLOR-CON Take 1 tablet (20 mEq total) by mouth daily for 7 days.      Follow-up Information    Alleen Borne, MD Follow up.   Specialty:  Cardiothoracic Surgery Why:  Your follow-up appointment is on December 30, 2017 at 10 AM.  Please arrive at 9:30 AM for a chest x-ray located at [facility] which is on the first floor of our building. Contact information: [address] [place] [address] [phone]        Manley Mason, MD. Call in 1 day(s).   Specialty:  Family Medicine Contact information: [address] [place] [address] [phone]        Abelino Derrick, PA-C Follow up.   Specialties:  Cardiology, Radiology Why:  Corine Shelter, PA-C 1/25 @8am  ([facility])  Contact information: [address] [place] [address] [phone]        nursing appointment Follow up.   Why:  Your chest tube suture removal appointment is on 12/04/2017 at  10:30am.  Contact information: Dr. Sharee Pimple office         The patient has been discharged on:   1.Beta Blocker:  Yes [ x  ]                              No   [   ]                              If No, reason:  2.Ace Inhibitor/ARB: Yes [ x ]                                     No  [    ]                                     If No, reason:  3.Statin:   Yes [ x  ]                  No  [   ]                  If No, reason:  4.Ecasa:  Yes  [ x ]                  No   [   ]                  If No, reason:    Signed: Sharlene Dory 11/29/2017, 10:03 AM

## 2017-11-27 NOTE — Progress Notes (Signed)
CARDIAC REHAB PHASE I   PRE:  Rate/Rhythm: 76 SR  BP:  Supine:   Sitting: 163/98  Standing:    SaO2: 95%RA  MODE:  Ambulation: 750 ft   POST:  Rate/Rhythm: 93 SR  BP:  Supine: 205/109,  196/107  Sitting:   Standing:    SaO2: 93-96%RA 0922-1020 Pt walked 750 ft on RA with rolling walker with steady gait. Wife stated he has access to walker at home if needed. BP elevated after walk. Put in bed for pacing wire removal. Wife here for ed so completed with her and pt who voiced understanding. Reviewed sternal precautions, IS, ex guidelines, diet- watching sodium, carbs and heart healthy eating, not smoking and CPR 2. Will refer to [facility] CRP 2. Gave pt smoking cessation handout and discussed calling 1800quitnow if needed. Pt stated he is not going to begin smoking again. RN aware of elevated BP.   Luetta Nuttingharlene Nicholous Girgenti, RN BSN  11/27/2017 10:18 AM

## 2017-11-27 NOTE — Progress Notes (Signed)
Patient education completed for removal of pacer wires, he verbalized understanding, pacer wires removed as ordered, procedure well tolerated, patient now on bedrest will continue to monitor.

## 2017-11-27 NOTE — Progress Notes (Addendum)
[address]       [place] [id number]             [phone]      4 Days Post-Op Procedure(s) (LRB): CORONARY ARTERY BYPASS GRAFTING (CABG), times two using right saphaneous vein, and left internal mammary artery. (N/A) TRANSESOPHAGEAL ECHOCARDIOGRAM (TEE) (N/A) Subjective: Maintaining sinus, QTc 484 on po amio Feels ok BP control is still somewhat poor  Objective: Vital signs in last 24 hours: Temp:  [97.7 F (36.5 C)-98.6 F (37 C)] 97.7 F (36.5 C) (01/04 0508) Pulse Rate:  [64-79] 64 (01/04 0508) Cardiac Rhythm: Normal sinus rhythm (01/03 1959) Resp:  [23-36] 23 (01/04 0514) BP: (147-175)/(88-93) 175/88 (01/04 0508) SpO2:  [94 %-97 %] 97 % (01/04 0508) Weight:  [197 lb 9.6 oz (89.6 kg)] 197 lb 9.6 oz (89.6 kg) (01/04 0514)  Hemodynamic parameters for last 24 hours:    Intake/Output from previous day: 01/03 0701 - 01/04 0700 In: 640.3 [P.O.:480; I.V.:160.3] Out: -  Intake/Output this shift: No intake/output data recorded.  General appearance: alert, cooperative and no distress Heart: regular rate and rhythm Lungs: clear to auscultation bilaterally Abdomen: benign Extremities: RLE>LLE edema Wound: incis healing well some serosang drainage from CT sites and RLE saph vein harvest site  Lab Results: Recent Labs    11/25/17 0408 11/26/17 0246  WBC 9.3 10.2  HGB 11.4* 10.4*  HCT 34.1* 32.3*  PLT 103* 114*   BMET:  Recent Labs    11/25/17 0408 11/26/17 0246  NA 134* 132*  K 3.9 4.2  CL 105 101  CO2 24 24  GLUCOSE 125* 117*  BUN 15 15  CREATININE 0.91 0.74  CALCIUM 8.4* 8.1*    PT/INR: No results for input(s): LABPROT, INR in the last 72 hours. ABG    Component Value Date/Time   PHART 7.353 11/24/2017 0807   HCO3 20.9 11/24/2017 0807   TCO2 22 11/24/2017 1639   ACIDBASEDEF 4.0 (H) 11/24/2017 0807   O2SAT 66.8 11/25/2017 0422   CBG (last 3)  Recent Labs    11/24/17 2351 11/25/17 0404 11/25/17 0804  GLUCAP 102*  139* 122*    Meds Scheduled Meds: . amiodarone  400 mg Oral BID  . aspirin EC  325 mg Oral Daily  . atorvastatin  80 mg Oral q1800  . docusate sodium  200 mg Oral Daily  . furosemide  40 mg Oral Daily  . lisinopril  10 mg Oral Daily  . mouth rinse  15 mL Mouth Rinse BID  . metoprolol tartrate  37.5 mg Oral BID  . nicotine  7 mg Transdermal Daily  . pantoprazole  40 mg Oral QAC breakfast  . potassium chloride  20 mEq Oral BID  . sodium chloride flush  3 mL Intravenous Q12H   Continuous Infusions: . sodium chloride     PRN Meds:.sodium chloride, acetaminophen, bisacodyl **OR** bisacodyl, ketorolac, ondansetron **OR** ondansetron (ZOFRAN) IV, oxyCODONE, sodium chloride flush, traMADol  Xrays Dg Chest 2 View  Result Date: 11/26/2017 CLINICAL DATA:  Shortness of breath. History of coronary artery disease and CABG, current smoker. EXAM: CHEST  2 VIEW COMPARISON:  Portable chest x-ray of November 25, 2017 FINDINGS: The lungs are adequately inflated. There is increased density at the right lung base slightly more conspicuous today. There is a trace of blunting of the left lateral costophrenic angle which is stable. The cardiac silhouette is top-normal in size. The sternal wires are intact. The pulmonary vascularity is normal.  The bony thorax exhibits no acute abnormality. IMPRESSION: Increased density at the right lung base suggests subsegmental atelectasis. There is a trace of pleural fluid bilaterally. No overt CHF. Electronically Signed   By: [name] M.D.   On: 11/26/2017 09:32    Assessment/Plan: S/P Procedure(s) (LRB): CORONARY ARTERY BYPASS GRAFTING (CABG), times two using right saphaneous vein, and left internal mammary artery. (N/A) TRANSESOPHAGEAL ECHOCARDIOGRAM (TEE) (N/A)   1 hemodyn stable in sinus except HTN- will increase lisinopril but if still poor control will need additional agent 2 cont diuresis for volume overload 3 no new labs or CXR today 4 d/c epw's 5 sugars  adeq controlled, needs agresssive lifestyle modification long term for metabolic syndrome- he is motivated to make improvements    LOS: 9 days   Dale [name] 11/27/2017   Chart reviewed, patient examined, agree with above. Remains hypertensive. Lisinopril increased this am and will increase Lopressor to 50 bid. His weight is still over preop and he has some LE edema so will increase Lasix to bid for now. If bp control not improved may need additional agent like Norvasc added.

## 2017-11-28 MED ORDER — LISINOPRIL 40 MG PO TABS
40.0000 mg | ORAL_TABLET | Freq: Every day | ORAL | Status: DC
  Administered 2017-11-28 – 2017-11-29 (×2): 40 mg via ORAL
  Filled 2017-11-28 (×2): qty 1

## 2017-11-28 MED ORDER — LEVALBUTEROL HCL 0.63 MG/3ML IN NEBU: 0.6300 mg | INHALATION_SOLUTION | Freq: Four times a day (QID) | RESPIRATORY_TRACT | Status: DC | PRN

## 2017-11-28 MED ORDER — AMLODIPINE BESYLATE 5 MG PO TABS
5.0000 mg | ORAL_TABLET | Freq: Every day | ORAL | Status: DC
  Administered 2017-11-28 – 2017-11-29 (×2): 5 mg via ORAL
  Filled 2017-11-28 (×2): qty 1

## 2017-11-28 NOTE — Progress Notes (Signed)
1028- Patient just finished ambulating independently and tolerating fine. Per pt ambulated multiple times this morning. Probable d/c home tomorrow per surgeon note, will sign off at this time. Artist Paislinty M Katiria Calame, MS, ACSM CEP

## 2017-11-28 NOTE — Progress Notes (Addendum)
      [address]       [place] [id number]             [phone]      5 Days Post-Op Procedure(s) (LRB): CORONARY ARTERY BYPASS GRAFTING (CABG), times two using right saphaneous vein, and left internal mammary artery. (N/A) TRANSESOPHAGEAL ECHOCARDIOGRAM (TEE) (N/A) Subjective: Feels good this morning. Wants to go home tomorrow if he can.   Objective: Vital signs in last 24 hours: Temp:  [97.6 F (36.4 C)-98.6 F (37 C)] 97.6 F (36.4 C) (01/05 0424) Pulse Rate:  [62-83] 66 (01/05 0551) Cardiac Rhythm: Normal sinus rhythm (01/04 2003) Resp:  [17-27] 25 (01/05 0424) BP: (131-193)/(81-107) 193/94 (01/05 0551) SpO2:  [91 %-100 %] 95 % (01/05 0424) Weight:  [193 lb (87.5 kg)] 193 lb (87.5 kg) (01/05 0424)     Intake/Output from previous day: 01/04 0701 - 01/05 0700 In: 240 [P.O.:240] Out: -  Intake/Output this shift: No intake/output data recorded.  General appearance: alert, cooperative and no distress Heart: regular rate and rhythm, S1, S2 normal, no murmur, click, rub or gallop Lungs: clear to auscultation bilaterally Abdomen: soft, non-tender; bowel sounds normal; no masses,  no organomegaly Extremities: 1+ non pitting edema in upper and lower extremity Wound: clean and dry. EVH site clean and dry  Lab Results: Recent Labs    11/26/17 0246  WBC 10.2  HGB 10.4*  HCT 32.3*  PLT 114*   BMET:  Recent Labs    11/26/17 0246  NA 132*  K 4.2  CL 101  CO2 24  GLUCOSE 117*  BUN 15  CREATININE 0.74  CALCIUM 8.1*    PT/INR: No results for input(s): LABPROT, INR in the last 72 hours. ABG    Component Value Date/Time   PHART 7.353 11/24/2017 0807   HCO3 20.9 11/24/2017 0807   TCO2 22 11/24/2017 1639   ACIDBASEDEF 4.0 (H) 11/24/2017 0807   O2SAT 66.8 11/25/2017 0422   CBG (last 3)  No results for input(s): GLUCAP in the last 72 hours.  Assessment/Plan: S/P Procedure(s) (LRB): CORONARY ARTERY BYPASS GRAFTING (CABG), times two using  right saphaneous vein, and left internal mammary artery. (N/A) TRANSESOPHAGEAL ECHOCARDIOGRAM (TEE) (N/A)  1. BP uncontrolled 151/83, 176/107, 193/94 were the last few readings. On Lopressor 50mg  and I increased his lisinopril to 40mg  daily which is double his home dose. Will likely also need Norvasc added. HR in the 60s so I would not increase the Lopressor at this time.  2. Pulm-tolerating room air with good oxygenation. 3. Renal-creatinine 0.74, electrolytes okay. Weight is trending down. On Lasix BID.  4. H and H stable, platelets trending up.  5. Blood glucose level well controlled on his current regimen  Plan: continue aggressive diuresis and titration of blood pressure medication. Possibly home tomorrow if BP improved.     LOS: 10 days    Sharlene Doryessa N Conte 11/28/2017  If bp controlled poss home tomorrow  Wound intact  I have seen and examined Dale [name] and agree with the above assessment  and plan.  Delight OvensEdward B Linnea Todisco MD [facility] [phone] Office [phone] 11/28/2017 10:04 AM

## 2017-11-29 MED ORDER — POTASSIUM CHLORIDE CRYS ER 20 MEQ PO TBCR: 20.0000 meq | EXTENDED_RELEASE_TABLET | Freq: Every day | ORAL | 0 refills | Status: DC

## 2017-11-29 MED ORDER — AMIODARONE HCL 200 MG PO TABS: ORAL_TABLET | ORAL | 1 refills | Status: DC

## 2017-11-29 MED ORDER — METOPROLOL TARTRATE 50 MG PO TABS: 50.0000 mg | ORAL_TABLET | Freq: Two times a day (BID) | ORAL | 1 refills | Status: DC

## 2017-11-29 MED ORDER — AMLODIPINE BESYLATE 5 MG PO TABS: 5.0000 mg | ORAL_TABLET | Freq: Every day | ORAL | 1 refills | Status: DC

## 2017-11-29 MED ORDER — ACETAMINOPHEN 325 MG PO TABS: 650.0000 mg | ORAL_TABLET | Freq: Four times a day (QID) | ORAL | Status: AC | PRN

## 2017-11-29 MED ORDER — ASPIRIN 325 MG PO TBEC: 325.0000 mg | DELAYED_RELEASE_TABLET | Freq: Every day | ORAL | 0 refills | Status: DC

## 2017-11-29 MED ORDER — ATORVASTATIN CALCIUM 80 MG PO TABS: 80.0000 mg | ORAL_TABLET | Freq: Every day | ORAL | 1 refills | Status: DC

## 2017-11-29 MED ORDER — OXYCODONE HCL 5 MG PO TABS: 5.0000 mg | ORAL_TABLET | ORAL | 0 refills | Status: DC | PRN

## 2017-11-29 MED ORDER — FUROSEMIDE 40 MG PO TABS: 40.0000 mg | ORAL_TABLET | Freq: Every day | ORAL | 0 refills | Status: DC

## 2017-11-29 MED ORDER — NICOTINE 7 MG/24HR TD PT24: 7.0000 mg | MEDICATED_PATCH | Freq: Every day | TRANSDERMAL | 0 refills | Status: AC

## 2017-11-29 NOTE — Progress Notes (Addendum)
      [address]       [place] [id number]             [phone]     Primary cardiologist: Dr. Nanetta BattyJonathan Berry   6 Days Post-Op Procedure(s) (LRB): CORONARY ARTERY BYPASS GRAFTING (CABG), times two using right saphaneous vein, and left internal mammary artery. (N/A) TRANSESOPHAGEAL ECHOCARDIOGRAM (TEE) (N/A) Subjective: Doing well this morning. Wants to go home today.   Objective: Vital signs in last 24 hours: Temp:  [98.6 F (37 C)-98.7 F (37.1 C)] 98.6 F (37 C) (01/06 0527) Pulse Rate:  [60-120] 60 (01/06 0527) Cardiac Rhythm: Normal sinus rhythm (01/06 0527) Resp:  [16-18] 17 (01/06 0527) BP: (133-162)/(84-101) 133/84 (01/06 0527) SpO2:  [94 %-97 %] 97 % (01/06 0527) Weight:  [189 lb 9.5 oz (86 kg)] 189 lb 9.5 oz (86 kg) (01/06 0223)     Intake/Output from previous day: 01/05 0701 - 01/06 0700 In: 3 [I.V.:3] Out: 600 [Urine:600] Intake/Output this shift: No intake/output data recorded.  General appearance: alert, cooperative and no distress Heart: regular rate and rhythm, S1, S2 normal, no murmur, click, rub or gallop Lungs: clear to auscultation bilaterally Abdomen: soft, non-tender; bowel sounds normal; no masses,  no organomegaly Extremities: extremities normal, atraumatic, no cyanosis or edema Wound: clean and dry  Lab Results: No results for input(s): WBC, HGB, HCT, PLT in the last 72 hours. BMET: No results for input(s): NA, K, CL, CO2, GLUCOSE, BUN, CREATININE, CALCIUM in the last 72 hours.  PT/INR: No results for input(s): LABPROT, INR in the last 72 hours. ABG    Component Value Date/Time   PHART 7.353 11/24/2017 0807   HCO3 20.9 11/24/2017 0807   TCO2 22 11/24/2017 1639   ACIDBASEDEF 4.0 (H) 11/24/2017 0807   O2SAT 66.8 11/25/2017 0422   CBG (last 3)  No results for input(s): GLUCAP in the last 72 hours.  Assessment/Plan: S/P Procedure(s) (LRB): CORONARY ARTERY BYPASS GRAFTING (CABG), times two using right saphaneous  vein, and left internal mammary artery. (N/A) TRANSESOPHAGEAL ECHOCARDIOGRAM (TEE) (N/A)  1. BP better this morning 143/101,143/101,and 133/84  were the last few readings. On Lopressor 50mg , lisinopril to 40mg  daily, and Norvasc 5mg  daily. HR in the 60s NSR. QTc is 588. On 400mg  oral Amio, will order a 12 lead EKG.  2. Pulm-tolerating room air with good oxygenation. 3. Renal-creatinine 0.74, electrolytes okay. Weight is trending down. On Lasix BID. Appears to be back down to baseline so will change to Lasix 40mg  daily for a few days.  4. H and H stable, platelets trending up.  5. Blood glucose level well controlled on his current regimen  Plan: home today. Will order another EKG today to further evaluate QTc. Will need close follow-up with cardiology for BP management.    LOS: 11 days    Sharlene Doryessa N Conte 11/29/2017  Patient feels well , ekg to check qtc , home on decreased dose of Cordarone Plan home today  I have seen and examined Conchita ParisBilly Lamontagne and agree with the above assessment  and plan.  Delight OvensEdward B Emmilyn Crooke MD [facility] [phone] Office [phone] 11/29/2017 10:46 AM

## 2017-11-29 NOTE — Progress Notes (Signed)
Discharged to home with family office visits in place teaching done  

## 2017-11-29 NOTE — Plan of Care (Signed)
  Progressing Health Behavior/Discharge Planning: Ability to manage health-related needs will improve 11/29/2017 0005 - Progressing by Ruel FavorsBrown-Staples, Ree Alcalde, RN Clinical Measurements: Ability to maintain clinical measurements within normal limits will improve 11/29/2017 0005 - Progressing by Ruel FavorsBrown-Staples, Corianne Buccellato, RN Diagnostic test results will improve 11/29/2017 0005 - Progressing by Ruel FavorsBrown-Staples, Gerritt Galentine, RN Cardiovascular complication will be avoided 11/29/2017 0005 - Progressing by Ruel FavorsBrown-Staples, Rennie Hack, RN Cardiac: Hemodynamic stability will improve 11/29/2017 0005 - Progressing by Ruel FavorsBrown-Staples, Hiedi Touchton, RN Clinical Measurements: Postoperative complications will be avoided or minimized 11/29/2017 0005 - Progressing by Ruel FavorsBrown-Staples, Forney Kleinpeter, RN Education: Knowledge of disease or condition will improve 11/29/2017 0005 - Progressing by Ruel FavorsBrown-Staples, Roel Douthat, RN Understanding of medication regimen will improve 11/29/2017 0005 - Progressing by Ruel FavorsBrown-Staples, Ailed Defibaugh, RN Cardiac: Ability to achieve and maintain adequate cardiopulmonary perfusion will improve 11/29/2017 0005 - Progressing by Ruel FavorsBrown-Staples, Misbah Hornaday, RN

## 2017-11-29 NOTE — Discharge Instructions (Signed)
Coronary Artery Bypass Grafting, Care After °This sheet gives you information about how to care for yourself after your procedure. Your health care provider may also give you more specific instructions. If you have problems or questions, contact your health care provider. °What can I expect after the procedure? °After the procedure, it is common to have: °· Nausea and a lack of appetite. °· Constipation. °· Weakness and fatigue. °· Depression or irritability. °· Pain or discomfort in your incision areas. ° °Follow these instructions at home: °Medicines °· Take over-the-counter and prescription medicines only as told by your health care provider. Do not stop taking medicines or start any new medicines without approval from your health care provider. °· If you were prescribed an antibiotic medicine, take it as told by your health care provider. Do not stop taking the antibiotic even if you start to feel better. °· Do not drive or use heavy machinery while taking prescription pain medicine. °Incision care °· Follow instructions from your health care provider about how to take care of your incisions. Make sure you: °? Wash your hands with soap and water before you change your bandage (dressing). If soap and water are not available, use hand sanitizer. °? Change your dressing as told by your health care provider. °? Leave stitches (sutures), skin glue, or adhesive strips in place. These skin closures may need to stay in place for 2 weeks or longer. If adhesive strip edges start to loosen and curl up, you may trim the loose edges. Do not remove adhesive strips completely unless your health care provider tells you to do that. °· Keep incision areas clean, dry, and protected. °· Check your incision areas every day for signs of infection. Check for: °? More redness, swelling, or pain. °? More fluid or blood. °? Warmth. °? Pus or a bad smell. °· If incisions were made in your legs: °? Avoid crossing your legs. °? Avoid  sitting for long periods of time. Change positions every 30 minutes. °? Raise (elevate) your legs when you are sitting. °Bathing °· Do not take baths, swim, or use a hot tub until your health care provider approves. °· Only take sponge baths. Pat the incisions dry. Do not rub incisions with a washcloth or towel. °· Ask your health care provider when you can shower. °Eating and drinking °· Eat foods that are high in fiber, such as raw fruits and vegetables, whole grains, beans, and nuts. Meats should be lean cut. Avoid canned, processed, and fried foods. This can help prevent constipation and is a recommended part of a heart-healthy diet. °· Drink enough fluid to keep your urine clear or pale yellow. °· Limit alcohol intake to no more than 1 drink a day for nonpregnant women and 2 drinks a day for men. One drink equals 12 oz of beer, 5 oz of wine, or 1½ oz of hard liquor. °Activity °· Rest and limit your activity as told by your health care provider. You may be instructed to: °? Stop any activity right away if you have chest pain, shortness of breath, irregular heartbeats, or dizziness. Get help right away if you have any of these symptoms. °? Move around frequently for short periods or take short walks as directed by your health care provider. Gradually increase your activities. You may need physical therapy or cardiac rehabilitation to help strengthen your muscles and build your endurance. °? Avoid lifting, pushing, or pulling anything that is heavier than 10 lb (4.5 kg) for at   least 6 weeks or as told by your health care provider. °· Do not drive until your health care provider approves. °· Ask your health care provider when you may return to work. °· Ask your health care provider when you may resume sexual activity. °General instructions °· Do not use any products that contain nicotine or tobacco, such as cigarettes and e-cigarettes. If you need help quitting, ask your health care provider. °· Take 2-3 deep  breaths every few hours during the day, while you recover. This helps expand your lungs and prevent complications like pneumonia after surgery. °· If you were given a device called an incentive spirometer, use it several times a day to practice deep breathing. Support your chest with a pillow or your arms when you take deep breaths or cough. °· Wear compression stockings as told by your health care provider. These stockings help to prevent blood clots and reduce swelling in your legs. °· Weigh yourself every day. This helps identify if your body is holding (retaining) fluid that may make your heart and lungs work harder. °· Keep all follow-up visits as told by your health care provider. This is important. °Contact a health care provider if: °· You have more redness, swelling, or pain around any incision. °· You have more fluid or blood coming from any incision. °· Any incision feels warm to the touch. °· You have pus or a bad smell coming from any incision °· You have a fever. °· You have swelling in your ankles or legs. °· You have pain in your legs. °· You gain 2 lb (0.9 kg) or more a day. °· You are nauseous or you vomit. °· You have diarrhea. °Get help right away if: °· You have chest pain that spreads to your jaw or arms. °· You are short of breath. °· You have a fast or irregular heartbeat. °· You notice a "clicking" in your breastbone (sternum) when you move. °· You have numbness or weakness in your arms or legs. °· You feel dizzy or light-headed. °Summary °· After the procedure, it is common to have pain or discomfort in the incision areas. °· Do not take baths, swim, or use a hot tub until your health care provider approves. °· Gradually increase your activities. You may need physical therapy or cardiac rehabilitation to help strengthen your muscles and build your endurance. °· Weigh yourself every day. This helps identify if your body is holding (retaining) fluid that may make your heart and lungs work  harder. °This information is not intended to replace advice given to you by your health care provider. Make sure you discuss any questions you have with your health care provider. °Document Released: 05/30/2005 Document Revised: 09/29/2016 Document Reviewed: 09/29/2016 °Elsevier Interactive Patient Education © 2018 [employer] ° ° °Endoscopic Saphenous Vein Harvesting, Care After °Refer to this sheet in the next few weeks. These instructions provide you with information about caring for yourself after your procedure. Your health care provider may also give you more specific instructions. Your treatment has been planned according to current medical practices, but problems sometimes occur. Call your health care provider if you have any problems or questions after your procedure. °What can I expect after the procedure? °After the procedure, it is common to have: °· Pain. °· Bruising. °· Swelling. °· Numbness. ° °Follow these instructions at home: °Medicine °· Take over-the-counter and prescription medicines only as told by your health care provider. °· Do not drive or operate heavy machinery   while taking prescription pain medicine. °Incision care ° °· Follow instructions from your health care provider about how to take care of the cut made during surgery (incision). Make sure you: °? Wash your hands with soap and water before you change your bandage (dressing). If soap and water are not available, use hand sanitizer. °? Change your dressing as told by your health care provider. °? Leave stitches (sutures), skin glue, or adhesive strips in place. These skin closures may need to be in place for 2 weeks or longer. If adhesive strip edges start to loosen and curl up, you may trim the loose edges. Do not remove adhesive strips completely unless your health care provider tells you to do that. °· Check your incision area every day for signs of infection. Check for: °? More redness, swelling, or pain. °? More fluid or  blood. °? Warmth. °? Pus or a bad smell. °General instructions °· Raise (elevate) your legs above the level of your heart while you are sitting or lying down. °· Do any exercises your health care providers have given you. These may include deep breathing, coughing, and walking exercises. °· Do not shower, take baths, swim, or use a hot tub unless told by your health care provider. °· Wear your elastic stocking if told by your health care provider. °· Keep all follow-up visits as told by your health care provider. This is important. °Contact a health care provider if: °· Medicine does not help your pain. °· Your pain gets worse. °· You have new leg bruises or your leg bruises get bigger. °· You have a fever. °· Your leg feels numb. °· You have more redness, swelling, or pain around your incision. °· You have more fluid or blood coming from your incision. °· Your incision feels warm to the touch. °· You have pus or a bad smell coming from your incision. °Get help right away if: °· Your pain is severe. °· You develop pain, tenderness, warmth, redness, or swelling in any part of your leg. °· You have chest pain. °· You have trouble breathing. °This information is not intended to replace advice given to you by your health care provider. Make sure you discuss any questions you have with your health care provider. °Document Released: 07/23/2011 Document Revised: 04/17/2016 Document Reviewed: 09/24/2015 °Elsevier Interactive Patient Education © 2018 [employer] ° ° °

## 2017-12-04 ENCOUNTER — Other Ambulatory Visit: Payer: Self-pay

## 2017-12-04 ENCOUNTER — Ambulatory Visit (INDEPENDENT_AMBULATORY_CARE_PROVIDER_SITE_OTHER): Payer: Self-pay

## 2017-12-04 DIAGNOSIS — Z4802 Encounter for removal of sutures: Secondary | ICD-10-CM

## 2017-12-04 NOTE — Progress Notes (Signed)
Patient arrived for nurse visit to remove suture post- procedure.  Sutures times 3 were removed from upper abdomen with no signs/ symptoms of infection noted. Incision sites cleaned. Patient tolerated procedure well.  Patient/ family instructed to keep the incision sites clean and dry and refrain from the use of lotions or creams on incisions.  Patient/ family acknowledged instructions given.

## 2017-12-10 ENCOUNTER — Other Ambulatory Visit: Payer: Self-pay

## 2017-12-10 DIAGNOSIS — G8918 Other acute postprocedural pain: Secondary | ICD-10-CM

## 2017-12-10 MED ORDER — TRAMADOL HCL 50 MG PO TABS
50.0000 mg | ORAL_TABLET | Freq: Four times a day (QID) | ORAL | 0 refills | Status: DC | PRN
Start: 2017-12-10 — End: 2017-12-30

## 2017-12-10 NOTE — Telephone Encounter (Signed)
[facility] [phone]

## 2017-12-15 ENCOUNTER — Encounter: Payer: Self-pay | Admitting: Cardiology

## 2017-12-18 ENCOUNTER — Encounter: Payer: Self-pay | Admitting: Cardiology

## 2017-12-18 ENCOUNTER — Ambulatory Visit (INDEPENDENT_AMBULATORY_CARE_PROVIDER_SITE_OTHER): Payer: Self-pay | Admitting: Cardiology

## 2017-12-18 VITALS — BP 138/78 | HR 44 | Ht 67.5 in | Wt 189.6 lb

## 2017-12-18 DIAGNOSIS — Z951 Presence of aortocoronary bypass graft: Secondary | ICD-10-CM

## 2017-12-18 DIAGNOSIS — I480 Paroxysmal atrial fibrillation: Secondary | ICD-10-CM

## 2017-12-18 DIAGNOSIS — I214 Non-ST elevation (NSTEMI) myocardial infarction: Secondary | ICD-10-CM

## 2017-12-18 DIAGNOSIS — I10 Essential (primary) hypertension: Secondary | ICD-10-CM | POA: Insufficient documentation

## 2017-12-18 DIAGNOSIS — E785 Hyperlipidemia, unspecified: Secondary | ICD-10-CM

## 2017-12-18 MED ORDER — AMIODARONE HCL 200 MG PO TABS: ORAL_TABLET | ORAL | 1 refills | Status: DC

## 2017-12-18 MED ORDER — METOPROLOL TARTRATE 25 MG PO TABS: 25.0000 mg | ORAL_TABLET | Freq: Two times a day (BID) | ORAL | 11 refills | Status: DC

## 2017-12-18 NOTE — Assessment & Plan Note (Signed)
CABG x 2 11/23/17- Pt had ostial LAD single vessel CAD at cath. He underwent CABG x 1 with an LIMA-mLAD but post op TEE showed anterior septal HK and an SVG-dLAD was placed secondary to concern for LIMA patency. EF 50% post op.

## 2017-12-18 NOTE — Assessment & Plan Note (Signed)
LDL 104- high dose statin added

## 2017-12-18 NOTE — Patient Instructions (Addendum)
Medication Instructions: DECREASE the Metoprolol (Lopressor) to 25 mg twice daily (you may split the 50 mg tablet and take half a tablet twice a day) TAKE Amiodarone  200 mg daily  If you need a refill on your cardiac medications before your next appointment, please call your pharmacy.    Follow-Up: Your physician wants you to follow-up in: 6 weeks with Dr. San MorelleBerry You will receive a reminder letter in the mail two months in advance. If you don't receive a letter, please call our office at [phone] to schedule this follow-up appointment.   [facility] you for choosing [facility]!!

## 2017-12-18 NOTE — Assessment & Plan Note (Signed)
PAF post op- placed on Amiodarone, converted to NSR before discharge, not anticoagulated

## 2017-12-18 NOTE — Assessment & Plan Note (Signed)
Controlled.  

## 2017-12-18 NOTE — Progress Notes (Signed)
12/18/2017 Dale [name]   1956/09/02  [id number]  Primary Physician Vaught, Terrilyn Saver, MD Primary Cardiologist: Dr Allyson Sabal  HPI:  62 y/o married male, smoker, works as a Chartered certified accountant, from [place] presented to [facility] 11/18/17 with a NSTEMI. He was transported to [facility] and taken to the cath lab. Cath revealed 95% ostial LAD without other CAD. His EF was 50-55%. He was taken for CABG on 11/23/17. He had an LIMA-mLAD placed but post op he had anterior and septal HK by TEE and there was concern for patency of the LIMA. He was taken back and had an SVG-dLAD placed. EF by TEE was 50-55%. Post op course complicated by PAF. He was placed on Amiodarone and he concerted before discharge and not anticoagulated.  He is in the office today with his wife for follow up. He has done well at home. Some trouble sleeping but his wife tell me he had this pre op. He is not smoking. His HR is 45 but he has not had syncope or near syncope.   Current Outpatient Medications  Medication Sig Dispense Refill  . acetaminophen (TYLENOL) 325 MG tablet Take 2 tablets (650 mg total) by mouth every 6 (six) hours as needed for mild pain.    Marland Kitchen amiodarone (PACERONE) 200 MG tablet Take one tab (200mg ) once a day. 60 tablet 1  . amLODipine (NORVASC) 5 MG tablet Take 1 tablet (5 mg total) by mouth daily. 30 tablet 1  . aspirin 325 MG EC tablet Take 1 tablet (325 mg total) by mouth daily. 30 tablet 0  . atorvastatin (LIPITOR) 80 MG tablet Take 1 tablet (80 mg total) by mouth daily at 6 PM. 30 tablet 1  . furosemide (LASIX) 40 MG tablet Take 1 tablet (40 mg total) by mouth daily for 7 days. 7 tablet 0  . lisinopril (PRINIVIL,ZESTRIL) 20 MG tablet Take 20 mg by mouth 2 (two) times daily.    . metoprolol tartrate (LOPRESSOR) 25 MG tablet Take 1 tablet (25 mg total) by mouth 2 (two) times daily. 60 tablet 11  . nicotine (NICODERM CQ - DOSED IN MG/24 HR) 7 mg/24hr patch Place 1 patch (7 mg total) onto the skin daily. 28 patch 0    . oxyCODONE (OXY IR/ROXICODONE) 5 MG immediate release tablet Take 1 tablet (5 mg total) by mouth every 4 (four) hours as needed for severe pain. 30 tablet 0  . potassium chloride SA (K-DUR,KLOR-CON) 20 MEQ tablet Take 1 tablet (20 mEq total) by mouth daily for 7 days. 7 tablet 0  . traMADol (ULTRAM) 50 MG tablet Take 1 tablet (50 mg total) by mouth every 6 (six) hours as needed. 40 tablet 0   No current facility-administered medications for this visit.     Allergies  Allergen Reactions  . Chlorhexidine     rash  . Chantix [Varenicline] Other (See Comments)    Nightmares    Past Medical History:  Diagnosis Date  . HTN (hypertension)   . Tobacco use     Social History   Socioeconomic History  . Marital status: Married    Spouse name: Not on file  . Number of children: Not on file  . Years of education: Not on file  . Highest education level: Not on file  Social Needs  . Financial resource strain: Not on file  . Food insecurity - worry: Not on file  . Food insecurity - inability: Not on file  . Transportation needs - medical: Not  on file  . Transportation needs - non-medical: Not on file  Occupational History  . Not on file  Tobacco Use  . Smoking status: Current Every Day Smoker    Years: 45.00    Types: Cigarettes  . Smokeless tobacco: Never Used  Substance and Sexual Activity  . Alcohol use: No    Frequency: Never  . Drug use: No  . Sexual activity: Not on file  Other Topics Concern  . Not on file  Social History Narrative  . Not on file     Family History  Problem Relation Age of Onset  . Bradycardia Mother      Review of Systems: General: negative for chills, fever, night sweats or weight changes.  Cardiovascular: negative for chest pain, dyspnea on exertion, edema, orthopnea, palpitations, paroxysmal nocturnal dyspnea or shortness of breath Dermatological: negative for rash Respiratory: negative for cough or wheezing Urologic: negative for  hematuria Abdominal: negative for nausea, vomiting, diarrhea, bright red blood per rectum, melena, or hematemesis Neurologic: negative for visual changes, syncope, or dizziness All other systems reviewed and are otherwise negative except as noted above.    Blood pressure 138/78, pulse (!) 44, height 5' 7.5" (1.715 m), weight 189 lb 9.6 oz (86 kg).  General appearance: alert, cooperative and no distress Neck: no carotid bruit and no JVD Lungs: clear to auscultation bilaterally Heart: regular rate and rhythm Extremities: extremities normal, atraumatic, no cyanosis or edema Skin: Skin color, texture, turgor normal. 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 He underwent CABG x 1 with an LIMA-mLAD but post op TEE showed anterior septal HK and an SVG-dLAD was placed secondary to concern for LIMA patency. EF 50% post op.   Essential hypertension Controlled  Dyslipidemia LDL 104- high dose statin added  PAF (paroxysmal atrial fibrillation) (HCC) PAF post op- placed on Amiodarone, converted to NSR before discharge, not anticoagulated   PLAN  Decrease Lopressor to 25 mg BID. Contniue Amiodarone to 200 mg daily for now but we may be able to stop this at follow up. See Dr Allyson SabalBerry in 6 weeks.  Corine ShelterLuke Eran Mistry PA-C 12/18/2017 8:28 AM

## 2017-12-18 NOTE — Assessment & Plan Note (Signed)
Transferred [facility] 11/18/17

## 2017-12-29 ENCOUNTER — Other Ambulatory Visit: Payer: Self-pay | Admitting: Surgery

## 2017-12-29 DIAGNOSIS — Z951 Presence of aortocoronary bypass graft: Secondary | ICD-10-CM

## 2017-12-30 ENCOUNTER — Ambulatory Visit
Admission: RE | Admit: 2017-12-30 | Discharge: 2017-12-30 | Disposition: A | Discharge status: Home / Self Care | Payer: Self-pay | Source: Ambulatory Visit | Attending: Surgery | Admitting: Surgery

## 2017-12-30 ENCOUNTER — Encounter: Payer: Self-pay | Admitting: Surgery

## 2017-12-30 ENCOUNTER — Ambulatory Visit (INDEPENDENT_AMBULATORY_CARE_PROVIDER_SITE_OTHER): Payer: Self-pay | Admitting: Surgery

## 2017-12-30 VITALS — BP 149/86 | HR 55 | Resp 20 | Ht 67.5 in | Wt 192.0 lb

## 2017-12-30 DIAGNOSIS — I2510 Atherosclerotic heart disease of native coronary artery without angina pectoris: Secondary | ICD-10-CM

## 2017-12-30 DIAGNOSIS — Z951 Presence of aortocoronary bypass graft: Secondary | ICD-10-CM

## 2017-12-30 DIAGNOSIS — G8918 Other acute postprocedural pain: Secondary | ICD-10-CM

## 2017-12-30 MED ORDER — TRAMADOL HCL 50 MG PO TABS: 50.0000 mg | ORAL_TABLET | Freq: Four times a day (QID) | ORAL | 0 refills | Status: AC | PRN

## 2017-12-30 NOTE — Progress Notes (Signed)
HPI: Patient returns for routine postoperative follow-up having undergone coronary artery bypass graft surgery x2 on 11/23/2017.  He had a left internal mammary artery graft placed to the mid LAD.  This was a small vessel with marginal blood flow and the patient was noted to have some anterior ST changes with separating from bypass so a saphenous vein graft was placed to the distal LAD to augment blood flow.  The ST changes resolved and LV function looked good.. The patient's early postoperative recovery while in the hospital was notable for development of postoperative atrial fibrillation which converted with amiodarone.. Since hospital discharge the patient reports that he has been feeling well.  He has abstained from smoking with the help of a nicotine patch.  He has been walking without chest pain or shortness of breath.   Current Outpatient Medications  Medication Sig Dispense Refill  . acetaminophen (TYLENOL) 325 MG tablet Take 2 tablets (650 mg total) by mouth every 6 (six) hours as needed for mild pain.    Marland Kitchen. amiodarone (PACERONE) 200 MG tablet Take one tab (200mg ) once a day. 60 tablet 1  . amLODipine (NORVASC) 5 MG tablet Take 1 tablet (5 mg total) by mouth daily. 30 tablet 1  . aspirin 325 MG EC tablet Take 1 tablet (325 mg total) by mouth daily. 30 tablet 0  . atorvastatin (LIPITOR) 80 MG tablet Take 1 tablet (80 mg total) by mouth daily at 6 PM. 30 tablet 1  . lisinopril (PRINIVIL,ZESTRIL) 20 MG tablet Take 20 mg by mouth 2 (two) times daily.    . metoprolol tartrate (LOPRESSOR) 25 MG tablet Take 1 tablet (25 mg total) by mouth 2 (two) times daily. (Patient taking differently: Take 12.5 mg by mouth 2 (two) times daily. ) 60 tablet 11  . traMADol (ULTRAM) 50 MG tablet Take 1 tablet (50 mg total) by mouth every 6 (six) hours as needed. 40 tablet 0  . nicotine (NICODERM CQ - DOSED IN MG/24 HR) 7 mg/24hr patch Place 1 patch (7 mg total) onto the skin daily. (Patient not taking:  Reported on 12/30/2017) 28 patch 0   No current facility-administered medications for this visit.     Physical Exam: BP (!) 149/86   Pulse (!) 55   Resp 20   Ht 5' 7.5" (1.715 m)   Wt 192 lb (87.1 kg)   SpO2 98% Comment: RA  BMI 29.63 kg/m  He looks well. Lung exam is clear. Cardiac exam shows a regular rate and rhythm with normal heart sounds. Chest incision is healing well and sternum is stable. The leg incision is healing well and there is no peripheral edema.    Diagnostic Tests:  CLINICAL DATA:  Status post CABG.  Follow-up examination.  EXAM: CHEST  2 VIEW  COMPARISON:  11/26/2017  FINDINGS: There is no focal parenchymal opacity. There is no pleural effusion or pneumothorax. The heart and mediastinal contours are unremarkable. There is evidence of prior CABG.  The osseous structures are unremarkable.  IMPRESSION: No active cardiopulmonary disease.   Electronically Signed   By: Elige KoHetal  Patel   On: 12/30/2017 09:51   Impression:  Overall I think he is doing well. I encouraged him to continue walking.  I told him he could drive his car but should not lift anything heavier than 10 lbs for three months postop.  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  I told him to see how his recovery goes and to talk to his employer to see if he can go back part-time with lifting restrictions if he really wants to return to work before 3 months postop.   Plan:  He will continue follow-up with cardiology and they will decide about when to discontinue his amiodarone.  He will return to see me if he has any problems with his incisions or if he needs a release to return to work.   Alleen Borne, MD [facility] [phone]

## 2018-01-26 ENCOUNTER — Encounter: Payer: Self-pay | Admitting: Cardiovascular Disease

## 2018-01-26 ENCOUNTER — Ambulatory Visit (INDEPENDENT_AMBULATORY_CARE_PROVIDER_SITE_OTHER): Payer: Self-pay | Admitting: Cardiovascular Disease

## 2018-01-26 VITALS — BP 137/88 | HR 52 | Ht 67.5 in | Wt 197.6 lb

## 2018-01-26 DIAGNOSIS — I10 Essential (primary) hypertension: Secondary | ICD-10-CM

## 2018-01-26 DIAGNOSIS — I480 Paroxysmal atrial fibrillation: Secondary | ICD-10-CM

## 2018-01-26 DIAGNOSIS — E785 Hyperlipidemia, unspecified: Secondary | ICD-10-CM

## 2018-01-26 DIAGNOSIS — Z951 Presence of aortocoronary bypass graft: Secondary | ICD-10-CM

## 2018-01-26 MED ORDER — ATORVASTATIN CALCIUM 80 MG PO TABS: 80.0000 mg | ORAL_TABLET | Freq: Every day | ORAL | 6 refills | Status: DC

## 2018-01-26 MED ORDER — AMLODIPINE BESYLATE 5 MG PO TABS: 5.0000 mg | ORAL_TABLET | Freq: Every day | ORAL | 6 refills | Status: DC

## 2018-01-26 NOTE — Addendum Note (Signed)
Addended by: Evans LanceSTOVER, Ian Cavey W on: 01/26/2018 11:47 AM   Modules accepted: Orders

## 2018-01-26 NOTE — Assessment & Plan Note (Signed)
History of essential hypertension blood pressure measured 137/88. He is on lisinopril and metoprolol as well as amlodipine. He says that his blood pressure is better when he takes 40 mg of lisinopril a morning as opposed to 20 twice a day. We'll have him keep a blood pressure log twice a day for month and see Kristin back in one month for follow-up.

## 2018-01-26 NOTE — Assessment & Plan Note (Signed)
History of hyperlipidemia on high-dose statin therapy.  We will recheck a lipid and liver profile. 

## 2018-01-26 NOTE — Assessment & Plan Note (Signed)
History of postop PAF on amiodarone converting to sinus rhythm prior to charge. He is not anticoagulated.

## 2018-01-26 NOTE — Assessment & Plan Note (Signed)
History of non-STEMI 11/18/17 with catheterization performed by myself revealing high-grade ostial LAD disease which I felt was best treated by coronary artery bypass grafting previous EF was preserved. He had bypass grafting 2 on 11/23/17 by Dr Laneta SimmersBartle  with a LIMA to the mid LAD and a vein graft to the distal LAD. He has done well since. He denies chest pain or shortness of breath.

## 2018-01-26 NOTE — Patient Instructions (Addendum)
Medication Instructions: Your physician recommends that you continue on your current medications as directed. Please refer to the Current Medication list given to you today.  Discontinue Amiodarone.  Labwork: Your physician recommends that you return for a FASTING lipid profile and hepatic function panel.   Follow-Up: Your physician recommends that you schedule a follow-up appointment in: 1 month with PharmD in [facility]. Your physician has requested that you regularly monitor and record your blood pressure readings at home. Please use the same machine at the same time of day to check your readings and record them to bring to your follow-up visit.  We request that you follow-up in: 3 months with Corine ShelterLuke Kilroy, PA and in 6 months with Dr San MorelleBerry  You will receive a reminder letter in the mail two months in advance. If you don't receive a letter, please call our office to schedule the follow-up appointment.  If you need a refill on your cardiac medications before your next appointment, please call your pharmacy.

## 2018-01-26 NOTE — Progress Notes (Signed)
01/26/2018 Dale [name]   23-Nov-1956  [id number]  Primary Physician Vaught, Terrilyn Saverarolyn R, MD Primary Cardiologist: Runell GessJonathan J Caddie Randle MD FACP, [place]ACC, [place]HA, [place]  HPI:  Dale ParisBilly Galligan is a 62 y.o. mildly overweight married Caucasian male with a history of hypertension, 45 pack years of tobacco abuse which he has since discontinued. He is a Location managermachine operator. [facility] with chest pain and had positive enzymes. He was [facility]18[facility]17 where he underwent cardiac catheterization by myself revealing high-grade ostial LAD disease with preserved LV function. On 11/23/17 he underwent coronary artery bypass grafting 2 by Dr. Laneta SimmersBartle with a LIMA to his mid LAD and a vein graft to the distal LAD. His postop course uncomplicated. He did have some PAF which converted to sinus rhythm prior to discharge on amiodarone. He is not anticoagulated. He denies chest pain or shortness of breath.   Current Meds  Medication Sig  . acetaminophen (TYLENOL) 325 MG tablet Take 2 tablets (650 mg total) by mouth every 6 (six) hours as needed for mild pain.  Marland Kitchen. amiodarone (PACERONE) 200 MG tablet Take one tab (200mg ) once a day.  Marland Kitchen. amLODipine (NORVASC) 5 MG tablet Take 1 tablet (5 mg total) by mouth daily.  Marland Kitchen. aspirin 325 MG EC tablet Take 1 tablet (325 mg total) by mouth daily.  Marland Kitchen. atorvastatin (LIPITOR) 80 MG tablet Take 1 tablet (80 mg total) by mouth daily at 6 PM.  . lisinopril (PRINIVIL,ZESTRIL) 20 MG tablet Take 20 mg by mouth 2 (two) times daily.  . metoprolol tartrate (LOPRESSOR) 25 MG tablet Take 1 tablet (25 mg total) by mouth 2 (two) times daily. (Patient taking differently: Take 12.5 mg by mouth 2 (two) times daily. )  . nicotine (NICODERM CQ - DOSED IN MG/24 HR) 7 mg/24hr patch Place 1 patch (7 mg total) onto the skin daily.  . traMADol (ULTRAM) 50 MG tablet Take 1 tablet (50 mg total) by mouth every 6 (six) hours as needed.     Allergies  Allergen Reactions  .  Chlorhexidine     rash  . Chantix [Varenicline] Other (See Comments)    Nightmares    Social History   Socioeconomic History  . Marital status: Married    Spouse name: Not on file  . Number of children: Not on file  . Years of education: Not on file  . Highest education level: Not on file  Social Needs  . Financial resource strain: Not on file  . Food insecurity - worry: Not on file  . Food insecurity - inability: Not on file  . Transportation needs - medical: Not on file  . Transportation needs - non-medical: Not on file  Occupational History  . Not on file  Tobacco Use  . Smoking status: Current Every Day Smoker    Years: 45.00    Types: Cigarettes  . Smokeless tobacco: Never Used  Substance and Sexual Activity  . Alcohol use: No    Frequency: Never  . Drug use: No  . Sexual activity: Not on file  Other Topics Concern  . Not on file  Social History Narrative  . Not on file     Review of Systems: General: negative for chills, fever, night sweats or weight changes.  Cardiovascular: negative for chest pain, dyspnea on exertion, edema, orthopnea, palpitations, paroxysmal nocturnal dyspnea or shortness of breath Dermatological: negative for rash Respiratory: negative for cough or wheezing Urologic: negative for hematuria Abdominal: negative  for nausea, vomiting, diarrhea, bright red blood per rectum, melena, or hematemesis Neurologic: negative for visual changes, syncope, or dizziness All other systems reviewed and are otherwise negative except as noted above.    Blood pressure 137/88, pulse (!) 52, height 5' 7.5" (1.715 m), weight 197 lb 9.6 oz (89.6 kg).  General appearance: alert and no distress Neck: no adenopathy, no carotid bruit, no JVD, supple, symmetrical, trachea midline and thyroid not enlarged, symmetric, no tenderness/mass/nodules Lungs: clear to auscultation bilaterally Heart: regular rate and rhythm, S1, S2 normal, no murmur, click, rub or  gallop Extremities: extremities normal, atraumatic, no cyanosis or edema Pulses: 2+ and symmetric Skin: Skin color, texture, turgor normal. No rashes or lesions Neurologic: Alert and oriented X 3, normal strength and tone. Normal symmetric reflexes. Normal coordination and gait  EKG not performed today  ASSESSMENT AND PLAN:   S/P CABG x 2 History of non-STEMI 11/18/17 with catheterization performed by myself revealing high-grade ostial LAD disease which I felt was best treated by coronary artery bypass grafting previous EF was preserved. He had bypass grafting 2 on 11/23/17 by Dr Laneta Simmers  with a LIMA to the mid LAD and a vein graft to the distal LAD. He has done well since. He denies chest pain or shortness of breath.  Essential hypertension History of essential hypertension blood pressure measured 137/88. He is on lisinopril and metoprolol as well as amlodipine. He says that his blood pressure is better when he takes 40 mg of lisinopril a morning as opposed to 20 twice a day. We'll have him keep a blood pressure log twice a day for month and see Kristin back in one month for follow-up.  Dyslipidemia History of hyperlipidemia on high-dose statin therapy. We will recheck a lipid and liver profile  PAF (paroxysmal atrial fibrillation) (HCC) History of postop PAF on amiodarone converting to sinus rhythm prior to charge. He is not anticoagulated.      Runell Gess MD FACP,FACC,FAHA, [facility] 01/26/2018 11:41 AM

## 2018-01-29 ENCOUNTER — Ambulatory Visit: Payer: Self-pay | Admitting: Cardiovascular Disease

## 2018-02-16 LAB — LIPID PANEL
CHOL/HDL RATIO: 2.3 ratio (ref 0.0–5.0)
CHOLESTEROL TOTAL: 142 mg/dL (ref 100–199)
HDL: 63 mg/dL (ref >39)
LDL Calculated: 57 mg/dL (ref 0–99)
TRIGLYCERIDES: 109 mg/dL (ref 0–149)
VLDL Cholesterol Cal: 22 mg/dL (ref 5–40)

## 2018-02-16 LAB — HEPATIC FUNCTION PANEL
ALK PHOS: 81 IU/L (ref 39–117)
ALT: 9 IU/L (ref 0–44)
AST: 16 IU/L (ref 0–40)
Albumin: 4.5 g/dL (ref 3.6–4.8)
BILIRUBIN, DIRECT: 0.14 mg/dL (ref 0.00–0.40)
Bilirubin Total: 0.4 mg/dL (ref 0.0–1.2)
Total Protein: 7.2 g/dL (ref 6.0–8.5)

## 2018-03-03 ENCOUNTER — Ambulatory Visit (INDEPENDENT_AMBULATORY_CARE_PROVIDER_SITE_OTHER): Payer: Self-pay | Admitting: Pharmacist Clinician (PhC)/ Clinical Pharmacy Specialist

## 2018-03-03 ENCOUNTER — Encounter: Payer: Self-pay | Admitting: Pharmacist Clinician (PhC)/ Clinical Pharmacy Specialist

## 2018-03-03 VITALS — BP 158/86 | HR 48

## 2018-03-03 DIAGNOSIS — I10 Essential (primary) hypertension: Secondary | ICD-10-CM

## 2018-03-03 MED ORDER — ATORVASTATIN CALCIUM 80 MG PO TABS: 80.0000 mg | ORAL_TABLET | Freq: Every day | ORAL | 3 refills | Status: AC

## 2018-03-03 MED ORDER — CHLORTHALIDONE 25 MG PO TABS: 25.0000 mg | ORAL_TABLET | Freq: Every day | ORAL | 3 refills | Status: DC

## 2018-03-03 MED ORDER — LISINOPRIL 40 MG PO TABS: 40.0000 mg | ORAL_TABLET | Freq: Every day | ORAL | 3 refills | Status: DC

## 2018-03-03 MED ORDER — AMLODIPINE BESYLATE 5 MG PO TABS: 5.0000 mg | ORAL_TABLET | Freq: Every day | ORAL | 3 refills | Status: DC

## 2018-03-03 NOTE — Patient Instructions (Addendum)
   Your blood pressure today is 158/86 - goal is 130/80  Check your blood pressure at home daily and keep record of the readings.  Take your BP meds as follows:  10 am - lisinopril 40 mg (2 x 20 mg tabs), chlorthalidone 12.5 mg (1/2 tab)  and metoprolol 12.5 mg (1/2 tab)  1 am   - amlodipine 5 mg, atorvastatin 80 mg, metoprolol 12.5 mg (1/2 tab)  Bring all of your meds, your BP cuff and your record of home blood pressures to your next appointment.  Exercise as you're able, try to walk approximately 30 minutes per day.  Keep salt intake to a minimum, especially watch canned and prepared boxed foods.  Eat more fresh fruits and vegetables and fewer canned items.  Avoid eating in fast food restaurants.    HOW TO TAKE YOUR BLOOD PRESSURE: . Rest 5 minutes before taking your blood pressure. .  Don't smoke or drink caffeinated beverages for at least 30 minutes before. . Take your blood pressure before (not after) you eat. . Sit comfortably with your back supported and both feet on the floor (don't cross your legs). . Elevate your arm to heart level on a table or a desk. . Use the proper sized cuff. It should fit smoothly and snugly around your bare upper arm. There should be enough room to slip a fingertip under the cuff. The bottom edge of the cuff should be 1 inch above the crease of the elbow. . Ideally, take 3 measurements at one sitting and record the average.

## 2018-03-03 NOTE — Progress Notes (Signed)
03/03/2018 Dale [name] [birth year] [id number]   HPI:  Dale [name] is a 62 y.o. male patient of Dr Allyson Sabal, with a PMH below who presents today for hypertension clinic evaluation. In addition to hypertension, his medical history is significant for, hyperlipidemia,  CAD post CABG x 2 (LIMA to mid LAD, VG to distal LAD) and a 45 pack year history of tobacco abuse.  He did have post-operative AF, however converted back to sinus rhythm.  He is not currently anticoagulated.    Today he is here with his wife, and they are eager to get his blood pressure under control.   He currently take all his once daily medications around 9-10 am, then his second dose of metoprolol around 3 pm, before heading off to work.  He has no complaints of chest pain, shortness of breath or lower extremity edema.  States compliance with all medications.  Blood Pressure Goal:  130/80  Current Medications:  Lisinopril 40 mg qd - 9 am  Amlodipine 5 mg qd - 9 am  Metoprolol tart 25 mg bid - 9 am and 3 pm  Family Hx:  Father - goes to gym daily ("health freak")  Mother - has pacemaker, CHF, now 28  1 sister with COPD (smoker)  Social Hx:  Liquor 4-5 shots per day on weekends, 1-2 on weeknights after work; 1 coffee per day; 16oz Dr. Reino Kent (3/4 bottle), 1 cup sweet tea with meal  Diet:  Sometimes breakfast (eggs/biscuits), lunch is [employer] daily -  gets vegetable plate, potato salad and cornbread;   Snack at midnight (milk and cookie or brownie)  Exercise:  Yard work, Sales promotion account executive;  Home BP readings:  Home cuff Reli-On arm cuff;  Hasn't checked much since going back to work,  This week mostly 1400/90  Intolerances:   Labs:  11/26/17;  Na 132, K 4.2, Glu 117, BUN 15, Scr 0.74  CrCl (based on weight of 197.6 lb) 134.6  Wt Readings from Last 3 Encounters:  01/26/18 197 lb 9.6 oz (89.6 kg)  12/30/17 192 lb (87.1 kg)  12/18/17 189 lb 9.6 oz (86 kg)   BP Readings from Last 3 Encounters:    03/03/18 (!) 158/86  01/26/18 137/88  12/30/17 (!) 149/86   Pulse Readings from Last 3 Encounters:  03/03/18 (!) 48  01/26/18 (!) 52  12/30/17 (!) 55    Current Outpatient Medications  Medication Sig Dispense Refill  . acetaminophen (TYLENOL) 325 MG tablet Take 2 tablets (650 mg total) by mouth every 6 (six) hours as needed for mild pain.    Marland Kitchen amLODipine (NORVASC) 5 MG tablet Take 1 tablet (5 mg total) by mouth daily. 90 tablet 3  . aspirin 325 MG EC tablet Take 1 tablet (325 mg total) by mouth daily. 30 tablet 0  . atorvastatin (LIPITOR) 80 MG tablet Take 1 tablet (80 mg total) by mouth daily. 90 tablet 3  . chlorthalidone (HYGROTON) 25 MG tablet Take 1 tablet (25 mg total) by mouth daily. 30 tablet 3  . lisinopril (PRINIVIL,ZESTRIL) 40 MG tablet Take 1 tablet (40 mg total) by mouth daily. 90 tablet 3  . metoprolol tartrate (LOPRESSOR) 25 MG tablet Take 1 tablet (25 mg total) by mouth 2 (two) times daily. (Patient taking differently: Take 12.5 mg by mouth 2 (two) times daily. ) 60 tablet 11  . nicotine (NICODERM CQ - DOSED IN MG/24 HR) 7 mg/24hr patch Place 1 patch (7 mg total) onto the skin daily. 28  patch 0  . traMADol (ULTRAM) 50 MG tablet Take 1 tablet (50 mg total) by mouth every 6 (six) hours as needed. 40 tablet 0   No current facility-administered medications for this visit.     Allergies  Allergen Reactions  . Chlorhexidine     rash  . Chantix [Varenicline] Other (See Comments)    Nightmares    Past Medical History:  Diagnosis Date  . HTN (hypertension)   . Tobacco use     Blood pressure (!) 158/86, pulse (!) 48.  Essential hypertension Patient with hypertension, CAD post CABG x 2 on 11/23/18.  We will start him on chlorthalidone 12.5 mg once daily and move his amlodipine, atorvastatin and second dose of metoprolol to midnight when he gets home from work.  He will take his morning medications around 10 am.   He was praised on his smoking cessation.  Will repeat a  BMET in 2 weeks, as his sodium was a bit low, but that was 3 months ago.  Hopefully this will not preclude us using the chlorthalidone.  He is to return in 3 weeks for follow up, at which time I explained that we may need to up his amlodipine if we are not to goal.  Answered all questions from patient and his wife.  They will monitor home BP at least 3 times per week and bring that information to his next appointment as well.    Phillips HayKristin Alvstad PharmD CPP [facility] [address] [place], [place] [id number] [phone]

## 2018-03-03 NOTE — Assessment & Plan Note (Signed)
Patient with hypertension, CAD post CABG x 2 on 11/23/18.  We will start him on chlorthalidone 12.5 mg once daily and move his amlodipine, atorvastatin and second dose of metoprolol to midnight when he gets home from work.  He will take his morning medications around 10 am.   He was praised on his smoking cessation.  Will repeat a BMET in 2 weeks, as his sodium was a bit low, but that was 3 months ago.  Hopefully this will not preclude us using the chlorthalidone.  He is to return in 3 weeks for follow up, at which time I explained that we may need to up his amlodipine if we are not to goal.  Answered all questions from patient and his wife.  They will monitor home BP at least 3 times per week and bring that information to his next appointment as well.

## 2018-03-26 ENCOUNTER — Telehealth: Payer: Self-pay | Admitting: Cardiovascular Disease

## 2018-03-26 NOTE — Telephone Encounter (Signed)
Patient had metabolic panel drawn on 4-25 at [facility].  (see below).   Spoke with patient's wife, she reports that he stopped the chlorthalidone 12.5 mg last Monday (April 29) because of nausea, upset stomach and back pain.  Symptoms resolved after 2-3 days.     Advised wife to hold lisinopril as well for the weekend and keep his appointment here on Monday morning.  We will repeat BMET in the office.  Wife voiced understanding.

## 2018-03-26 NOTE — Telephone Encounter (Signed)
New Message:     Claiborne Billings from  [facility] is calling to see if we received fax with results and to give some abnormal things from labs that is listed below. She ask that we call the pt since we are the ones adjusting medication BUN:53 Creat:3.05 EGFR Non afric:21 EFGFRN africa:24 Bun Creat ratio:17 Potassium:5.6    Everything else was normal

## 2018-03-29 ENCOUNTER — Ambulatory Visit (INDEPENDENT_AMBULATORY_CARE_PROVIDER_SITE_OTHER): Payer: Self-pay | Admitting: Pharmacist Clinician (PhC)/ Clinical Pharmacy Specialist

## 2018-03-29 DIAGNOSIS — I10 Essential (primary) hypertension: Secondary | ICD-10-CM

## 2018-03-29 LAB — BASIC METABOLIC PANEL
BUN/Creatinine Ratio: 15 (ref 10–24)
BUN: 21 mg/dL (ref 8–27)
CO2: 24 mmol/L (ref 20–29)
CREATININE: 1.39 mg/dL — AB (ref 0.76–1.27)
Calcium: 9.8 mg/dL (ref 8.6–10.2)
Chloride: 96 mmol/L (ref 96–106)
GFR calc Af Amer: 63 mL/min/{1.73_m2} (ref >59)
GFR, EST NON AFRICAN AMERICAN: 55 mL/min/{1.73_m2} — AB (ref >59)
Glucose: 95 mg/dL (ref 65–99)
Potassium: 5.2 mmol/L (ref 3.5–5.2)
SODIUM: 135 mmol/L (ref 134–144)

## 2018-03-29 NOTE — Patient Instructions (Signed)
Return for a a follow up appointment in 1 month  Your blood pressure today is 148/92  Check your blood pressure at home daily (if able) and keep record of the readings.  Take your BP meds as follows:  I will call you later today or tomorrow, once the labs are reviewed, to determine what to do with your medication.  Bring all of your meds, your BP cuff and your record of home blood pressures to your next appointment.  Exercise as you're able, try to walk approximately 30 minutes per day.  Keep salt intake to a minimum, especially watch canned and prepared boxed foods.  Eat more fresh fruits and vegetables and fewer canned items.  Avoid eating in fast food restaurants.    HOW TO TAKE YOUR BLOOD PRESSURE: . Rest 5 minutes before taking your blood pressure. .  Don't smoke or drink caffeinated beverages for at least 30 minutes before. . Take your blood pressure before (not after) you eat. . Sit comfortably with your back supported and both feet on the floor (don't cross your legs). . Elevate your arm to heart level on a table or a desk. . Use the proper sized cuff. It should fit smoothly and snugly around your bare upper arm. There should be enough room to slip a fingertip under the cuff. The bottom edge of the cuff should be 1 inch above the crease of the elbow. . Ideally, take 3 measurements at one sitting and record the average.

## 2018-03-29 NOTE — Progress Notes (Signed)
03/30/2018 Dale [name] [birth year] [id number]   HPI:  Dale [name] is a 62 y.o. male patient of Dale [name], with a PMH below who presents today for hypertension clinic evaluation. In addition to hypertension, his medical history is significant for, hyperlipidemia,  CAD post CABG x 2 (LIMA to mid LAD, VG to distal LAD) and a 45 pack year history of tobacco abuse.  He did have post-operative AF, however converted back to sinus rhythm.  He is not currently anticoagulated.    He was seen in CVRR clinic about 3 weeks ago and started on chlorthalidone 12.5 mg for a week then increased to 25 mg daily.  A BMET drawn about 2 weeks later showed SCr had jumped to 3.05, BUN 53 and potassium of 5.6.  We reached out to his wife to stop chlorthalidone and lisinopril.  Wife reports that he stopped the chlorthalidone after about 10 days because it was causing him nausea, upset stomach and low back pain.  Today they note that the back pain resolved within 2 days of discontinuing medication.    Blood Pressure Goal:  130/80  Current Medications:  Lisinopril 40 mg qd - 9 am  Amlodipine 5 mg qd - 9 am  Metoprolol tart 25 mg bid - 9 am and 3 pm  Family Hx:  Father - goes to gym daily ("health freak")  Mother - has pacemaker, CHF, now 68  1 sister with COPD (smoker)  Social Hx:  Liquor 4-5 shots per day on weekends, 1-2 on weeknights after work; 1 coffee per day; 16oz Dale. Reino [name] (3/4 bottle), 1 cup sweet tea with meal  Diet:  Sometimes breakfast (eggs/biscuits), lunch is [employer] daily -  gets vegetable plate, potato salad and cornbread;   Snack at midnight (milk and cookie or brownie)  Exercise:  Yard work, Sales promotion account executive;  Home BP readings:  Home cuff Reli-On arm cuff; forgot home readings, but states that they were averaging 130/80 with the chlorthalidone, but slightly higher since stopping.  Has not had any readings greater than 150 systolic that he is aware of.    Intolerances:    Chlorthalidone - increase SCr Labs:  03/18/18:  K 5.6, BUN 53, SCr 3.05 (drawn at PCP and called into our office on 5/3)  11/26/17;  Na 132, K 4.2, Glu 117, BUN 15, Scr 0.74  CrCl (based on weight of 197.6 lb) 134.6  Wt Readings from Last 3 Encounters:  01/26/18 197 lb 9.6 oz (89.6 kg)  12/30/17 192 lb (87.1 kg)  12/18/17 189 lb 9.6 oz (86 kg)   BP Readings from Last 3 Encounters:  03/29/18 (!) 148/92  03/03/18 (!) 158/86  01/26/18 137/88   Pulse Readings from Last 3 Encounters:  03/29/18 (!) 52  03/03/18 (!) 48  01/26/18 (!) 52    Current Outpatient Medications  Medication Sig Dispense Refill  . acetaminophen (TYLENOL) 325 MG tablet Take 2 tablets (650 mg total) by mouth every 6 (six) hours as needed for mild pain.    Marland Kitchen amLODipine (NORVASC) 10 MG tablet Take 1 tablet (10 mg total) by mouth daily. 30 tablet 5  . aspirin 325 MG EC tablet Take 1 tablet (325 mg total) by mouth daily. 30 tablet 0  . atorvastatin (LIPITOR) 80 MG tablet Take 1 tablet (80 mg total) by mouth daily. 90 tablet 3  . chlorthalidone (HYGROTON) 25 MG tablet Take 1 tablet (25 mg total) by mouth daily. 30 tablet 3  . lisinopril (PRINIVIL,ZESTRIL)  40 MG tablet Take 1 tablet (40 mg total) by mouth daily. 90 tablet 3  . metoprolol tartrate (LOPRESSOR) 25 MG tablet Take 1 tablet (25 mg total) by mouth 2 (two) times daily. (Patient taking differently: Take 12.5 mg by mouth 2 (two) times daily. ) 60 tablet 11  . nicotine (NICODERM CQ - DOSED IN MG/24 HR) 7 mg/24hr patch Place 1 patch (7 mg total) onto the skin daily. 28 patch 0  . traMADol (ULTRAM) 50 MG tablet Take 1 tablet (50 mg total) by mouth every 6 (six) hours as needed. 40 tablet 0   No current facility-administered medications for this visit.     Allergies  Allergen Reactions  . Chlorhexidine     rash  . Chantix [Varenicline] Other (See Comments)    Nightmares    Past Medical History:  Diagnosis Date  . HTN (hypertension)   . Tobacco use      Blood pressure (!) 148/92, pulse (!) 52.  Essential hypertension Patient with essential hypertension and recent acute kidney failure.  He has been off chlorthalidone for past 2-3 weeks and stopped his lisinopril just 3 days ago.  Repeat BMET today shows much improvement with SCr down to 1.39 and potassium to 5.2.  Will have him continue to hold lisinopril and increase the amlodipine from 5 mg to 10 mg daily.   Will also have him repeat BMET again in another 10 days, when off lisinopril longer.  He will return for follow up in another 3 weeks.     Dale [name] PharmD CPP [facility] [address] [place], [place] [address] [phone]

## 2018-03-30 MED ORDER — AMLODIPINE BESYLATE 10 MG PO TABS: 10.0000 mg | ORAL_TABLET | Freq: Every day | ORAL | 5 refills | Status: AC

## 2018-03-30 NOTE — Assessment & Plan Note (Signed)
Patient with essential hypertension and recent acute kidney failure.  He has been off chlorthalidone for past 2-3 weeks and stopped his lisinopril just 3 days ago.  Repeat BMET today shows much improvement with SCr down to 1.39 and potassium to 5.2.  Will have him continue to hold lisinopril and increase the amlodipine from 5 mg to 10 mg daily.   Will also have him repeat BMET again in another 10 days, when off lisinopril longer.  He will return for follow up in another 3 weeks.

## 2018-04-06 ENCOUNTER — Telehealth: Payer: Self-pay | Admitting: *Deleted

## 2018-04-06 DIAGNOSIS — N289 Disorder of kidney and ureter, unspecified: Secondary | ICD-10-CM

## 2018-04-06 NOTE — Telephone Encounter (Signed)
-----   Message from Runell Gess, MD sent at 03/30/2018  1:17 PM EDT ----- His serum creatinine is elevated compared to prior tracings.  I would repeat a basic metabolic panel.

## 2018-04-06 NOTE — Telephone Encounter (Signed)
Spoke with pt wife, aware of results and need for repeat. Lab orders entered.

## 2018-04-12 LAB — BASIC METABOLIC PANEL
BUN/Creatinine Ratio: 17 (ref 10–24)
BUN: 20 mg/dL (ref 8–27)
CALCIUM: 9.5 mg/dL (ref 8.6–10.2)
CHLORIDE: 99 mmol/L (ref 96–106)
CO2: 22 mmol/L (ref 20–29)
Creatinine, Ser: 1.16 mg/dL (ref 0.76–1.27)
GFR calc Af Amer: 79 mL/min/{1.73_m2} (ref >59)
GFR, EST NON AFRICAN AMERICAN: 68 mL/min/{1.73_m2} (ref >59)
Glucose: 100 mg/dL — ABNORMAL HIGH (ref 65–99)
POTASSIUM: 4.5 mmol/L (ref 3.5–5.2)
Sodium: 135 mmol/L (ref 134–144)

## 2018-04-29 ENCOUNTER — Ambulatory Visit (INDEPENDENT_AMBULATORY_CARE_PROVIDER_SITE_OTHER): Payer: Self-pay | Admitting: Pharmacist Clinician (PhC)/ Clinical Pharmacy Specialist

## 2018-04-29 VITALS — BP 148/88 | HR 52 | Ht 67.5 in | Wt 192.0 lb

## 2018-04-29 DIAGNOSIS — I10 Essential (primary) hypertension: Secondary | ICD-10-CM

## 2018-04-29 MED ORDER — LISINOPRIL 10 MG PO TABS: 10.0000 mg | ORAL_TABLET | Freq: Every day | ORAL | 3 refills | Status: AC

## 2018-04-29 NOTE — Assessment & Plan Note (Signed)
Home blood pressure readings have improved over the past month with the increase of amlodipine to 10 mg daily.  However he is still not quite to our goal of < 130 systolic.  As he still has 20 mg lisinopril tablets at home, will have him start back on 1/2 tablet (10 mg) once daily in the mornings.  He will repeat BMET in 10-14 days, to be sure SCr unchanged (he was on this for several years prior to the recent increase in SCr).   We will see him back in the office in 1 month for follow up.  He should continue to check home blood pressure readings as well as monitor his diet to keep sodium at a minimum.

## 2018-04-29 NOTE — Patient Instructions (Addendum)
Return for a a follow up appointment in 1 month  Your blood pressure today is 148/74    Check your blood pressure at home daily and keep record of the readings.  Take your BP meds as follows:  Lisinopril 10 mg qd - 9 am (1/2 of 20 mg tablet)  Amlodipine 10 mg qd - 9 am  Metoprolol tart 12.5 mg bid - 9 am and midnight  Bring all of your meds, your BP cuff and your record of home blood pressures to your next appointment.  Exercise as you're able, try to walk approximately 30 minutes per day.  Keep salt intake to a minimum, especially watch canned and prepared boxed foods.  Eat more fresh fruits and vegetables and fewer canned items.  Avoid eating in fast food restaurants.    HOW TO TAKE YOUR BLOOD PRESSURE: . Rest 5 minutes before taking your blood pressure. .  Don't smoke or drink caffeinated beverages for at least 30 minutes before. . Take your blood pressure before (not after) you eat. . Sit comfortably with your back supported and both feet on the floor (don't cross your legs). . Elevate your arm to heart level on a table or a desk. . Use the proper sized cuff. It should fit smoothly and snugly around your bare upper arm. There should be enough room to slip a fingertip under the cuff. The bottom edge of the cuff should be 1 inch above the crease of the elbow. . Ideally, take 3 measurements at one sitting and record the average.

## 2018-04-29 NOTE — Progress Notes (Signed)
04/29/2018 Dale [name] 1956/10/11 [id number]   HPI:  Dale [name] is a 62 y.o. male patient of Dr Allyson Sabal, with a PMH below who presents today for hypertension clinic evaluation. In addition to hypertension, his medical history is significant for, hyperlipidemia,  CAD post CABG x 2 (LIMA to mid LAD, VG to distal LAD) and a 45 pack year history of tobacco abuse.  He did have post-operative AF, however converted back to sinus rhythm.  He is not currently anticoagulated.    We have been following him in the CVRR clinic for a couple of months now.  We originally started him on chlorthalidone 12.5 mg daily, unfortunately this, on top of the lisinopril, caused his SCr to shoot up to 3 (drawn at PCP office, we only have a verabal).  Patient had stopped the medication about the time of blood draw due to lower back pain, nausea and stomach problems.  We held his lisinopril also, and his most recent SCr was back down to 1.16.  After that we gave him amlodipine and titrated him up to 10 mg daily.  He has tolerated this well and returns today on this as well as metoprolol 12.5 mg bid.    Today he is feeling well, no complaints of chest pain, shortness of breath or lower extremity edema.   He presents today with a list of home blood pressure readings as well as the home cuff.     Blood Pressure Goal:  130/80  Current Medications:   On hold due to elevated serum creatinine  Amlodipine 10 mg qd - 9 am  Metoprolol tart 12.5 mg bid - 9 am and midnight  Family Hx:  Father - goes to gym daily ("health freak")  Mother - has pacemaker, CHF, now 98  1 sister with COPD (smoker)  Social Hx:  Liquor still drinking about 8 oz (1/2 pint) on the weekends; 1 coffee per day; 16oz Dr. Reino Kent (3/4 bottle), 1 cup sweet tea with meal  Diet:  Notes his appetite has decreased somewhat recently.  Currently breakfast is cereal (high sugar cereals), lunch - ham sandwich or baked potato;   Snack at midnight (milk and cookie  or brownie)  Exercise:  Yard work, Sales promotion account executive;  Home BP readings:  Home cuff Equate arm cuff; last 16 days showed average of 136/74, range 122-145/59-78. .   Intolerances:   Chlorthalidone - increase SCr, back pain, nausea Labs:  04/12/18:  Na 135, K 4.5, Glu 100, BUN 20, SCr 1.16  03/18/18:  K 5.6, BUN 53, SCr 3.05 (drawn at PCP and called into our office on 5/3)  11/26/17;  Na 132, K 4.2, Glu 117, BUN 15, Scr 0.74   Wt Readings from Last 3 Encounters:  04/29/18 192 lb (87.1 kg)  01/26/18 197 lb 9.6 oz (89.6 kg)  12/30/17 192 lb (87.1 kg)   BP Readings from Last 3 Encounters:  04/29/18 (!) 148/88  03/29/18 (!) 148/92  03/03/18 (!) 158/86   Pulse Readings from Last 3 Encounters:  04/29/18 (!) 52  03/29/18 (!) 52  03/03/18 (!) 48    Current Outpatient Medications  Medication Sig Dispense Refill  . aspirin EC 81 MG tablet Take 81 mg by mouth daily.    Marland Kitchen acetaminophen (TYLENOL) 325 MG tablet Take 2 tablets (650 mg total) by mouth every 6 (six) hours as needed for mild pain.    Marland Kitchen amLODipine (NORVASC) 10 MG tablet Take 1 tablet (10 mg total) by mouth daily.  30 tablet 5  . atorvastatin (LIPITOR) 80 MG tablet Take 1 tablet (80 mg total) by mouth daily. 90 tablet 3  . chlorthalidone (HYGROTON) 25 MG tablet Take 1 tablet (25 mg total) by mouth daily. 30 tablet 3  . lisinopril (PRINIVIL,ZESTRIL) 10 MG tablet Take 1 tablet (10 mg total) by mouth daily. 90 tablet 3  . metoprolol tartrate (LOPRESSOR) 25 MG tablet Take 1 tablet (25 mg total) by mouth 2 (two) times daily. (Patient taking differently: Take 12.5 mg by mouth 2 (two) times daily. ) 60 tablet 11  . nicotine (NICODERM CQ - DOSED IN MG/24 HR) 7 mg/24hr patch Place 1 patch (7 mg total) onto the skin daily. 28 patch 0  . traMADol (ULTRAM) 50 MG tablet Take 1 tablet (50 mg total) by mouth every 6 (six) hours as needed. 40 tablet 0   No current facility-administered medications for this visit.     Allergies  Allergen  Reactions  . Chlorhexidine     rash  . Chantix [Varenicline] Other (See Comments)    Nightmares    Past Medical History:  Diagnosis Date  . HTN (hypertension)   . Tobacco use     Blood pressure (!) 148/88, pulse (!) 52, height 5' 7.5" (1.715 m), weight 192 lb (87.1 kg).  Essential hypertension Home blood pressure readings have improved over the past month with the increase of amlodipine to 10 mg daily.  However he is still not quite to our goal of < 130 systolic.  As he still has 20 mg lisinopril tablets at home, will have him start back on 1/2 tablet (10 mg) once daily in the mornings.  He will repeat BMET in 10-14 days, to be sure SCr unchanged (he was on this for several years prior to the recent increase in SCr).   We will see him back in the office in 1 month for follow up.  He should continue to check home blood pressure readings as well as monitor his diet to keep sodium at a minimum.    Phillips HayKristin Cataleah Stites PharmD CPP [facility] [address] [place], [place] [id number] [phone]

## 2018-05-14 LAB — BASIC METABOLIC PANEL
BUN / CREAT RATIO: 20 (ref 10–24)
BUN: 24 mg/dL (ref 8–27)
CO2: 22 mmol/L (ref 20–29)
Calcium: 9.6 mg/dL (ref 8.6–10.2)
Chloride: 100 mmol/L (ref 96–106)
Creatinine, Ser: 1.23 mg/dL (ref 0.76–1.27)
GFR calc Af Amer: 73 mL/min/{1.73_m2} (ref >59)
GFR calc non Af Amer: 63 mL/min/{1.73_m2} (ref >59)
GLUCOSE: 86 mg/dL (ref 65–99)
POTASSIUM: 4.6 mmol/L (ref 3.5–5.2)
Sodium: 136 mmol/L (ref 134–144)

## 2018-06-02 ENCOUNTER — Telehealth: Payer: Self-pay | Admitting: Pharmacist Clinician (PhC)/ Clinical Pharmacy Specialist

## 2018-06-02 NOTE — Telephone Encounter (Signed)
Patient unable to keep appointment for BP check this week.  Wife was in town and dropped off his most recent month of readings.  Currently taking lisinopril 10 mg daily, amlodipine 10 mg daily and metoprolol 12.5 mg bid.     32 home readings show range of 115-147/64-84, with an average of131/76.  Last BMET after restarting lisinopril was normal.    Continue with current medications, as long as every 10 or so readings is averaging 120-135 he should continue.  Should they notice a trend upward in readings they should call.  Wife voiced understanding.

## 2018-06-03 ENCOUNTER — Ambulatory Visit: Payer: Self-pay

## 2018-07-01 ENCOUNTER — Other Ambulatory Visit: Payer: Self-pay | Admitting: Pharmacist

## 2018-07-01 MED ORDER — METOPROLOL TARTRATE 25 MG PO TABS: 12.5000 mg | ORAL_TABLET | Freq: Two times a day (BID) | ORAL | 1 refills | Status: AC

## 2018-12-02 DIAGNOSIS — E7800 Pure hypercholesterolemia, unspecified: Secondary | ICD-10-CM | POA: Diagnosis not present

## 2018-12-02 DIAGNOSIS — N529 Male erectile dysfunction, unspecified: Secondary | ICD-10-CM | POA: Diagnosis not present

## 2018-12-02 DIAGNOSIS — Z125 Encounter for screening for malignant neoplasm of prostate: Secondary | ICD-10-CM | POA: Diagnosis not present

## 2018-12-02 DIAGNOSIS — I10 Essential (primary) hypertension: Secondary | ICD-10-CM | POA: Diagnosis not present

## 2018-12-02 DIAGNOSIS — I2510 Atherosclerotic heart disease of native coronary artery without angina pectoris: Secondary | ICD-10-CM | POA: Diagnosis not present

## 2019-01-25 DIAGNOSIS — Z87891 Personal history of nicotine dependence: Secondary | ICD-10-CM | POA: Diagnosis not present

## 2019-01-25 DIAGNOSIS — Z6831 Body mass index (BMI) 31.0-31.9, adult: Secondary | ICD-10-CM | POA: Diagnosis not present

## 2019-01-25 DIAGNOSIS — J069 Acute upper respiratory infection, unspecified: Secondary | ICD-10-CM | POA: Diagnosis not present

## 2019-03-09 DIAGNOSIS — L980 Pyogenic granuloma: Secondary | ICD-10-CM | POA: Diagnosis not present

## 2019-03-09 DIAGNOSIS — Z6831 Body mass index (BMI) 31.0-31.9, adult: Secondary | ICD-10-CM | POA: Diagnosis not present

## 2019-03-22 DIAGNOSIS — I2510 Atherosclerotic heart disease of native coronary artery without angina pectoris: Secondary | ICD-10-CM | POA: Diagnosis not present

## 2019-03-22 DIAGNOSIS — I10 Essential (primary) hypertension: Secondary | ICD-10-CM | POA: Diagnosis not present

## 2019-03-22 DIAGNOSIS — Z1331 Encounter for screening for depression: Secondary | ICD-10-CM | POA: Diagnosis not present

## 2019-03-22 DIAGNOSIS — N529 Male erectile dysfunction, unspecified: Secondary | ICD-10-CM | POA: Diagnosis not present

## 2019-09-14 ENCOUNTER — Telehealth: Payer: Self-pay | Admitting: *Deleted

## 2019-09-14 NOTE — Telephone Encounter (Signed)
A message was left, re: his follow up visit. 

## 2019-09-19 ENCOUNTER — Telehealth: Payer: Self-pay | Admitting: *Deleted

## 2019-09-19 NOTE — Telephone Encounter (Signed)
Mrs.Belk stated, her husband is going stay with his primary care for now.

## 2019-10-21 IMAGING — DX DG CHEST 1V PORT SAME DAY
1 series · 1 of 1 positions shown · non-contrast
Comparison: 11/23/2017

CLINICAL DATA: Wheeze.

EXAM:
PORTABLE CHEST 1 VIEW

[chest]
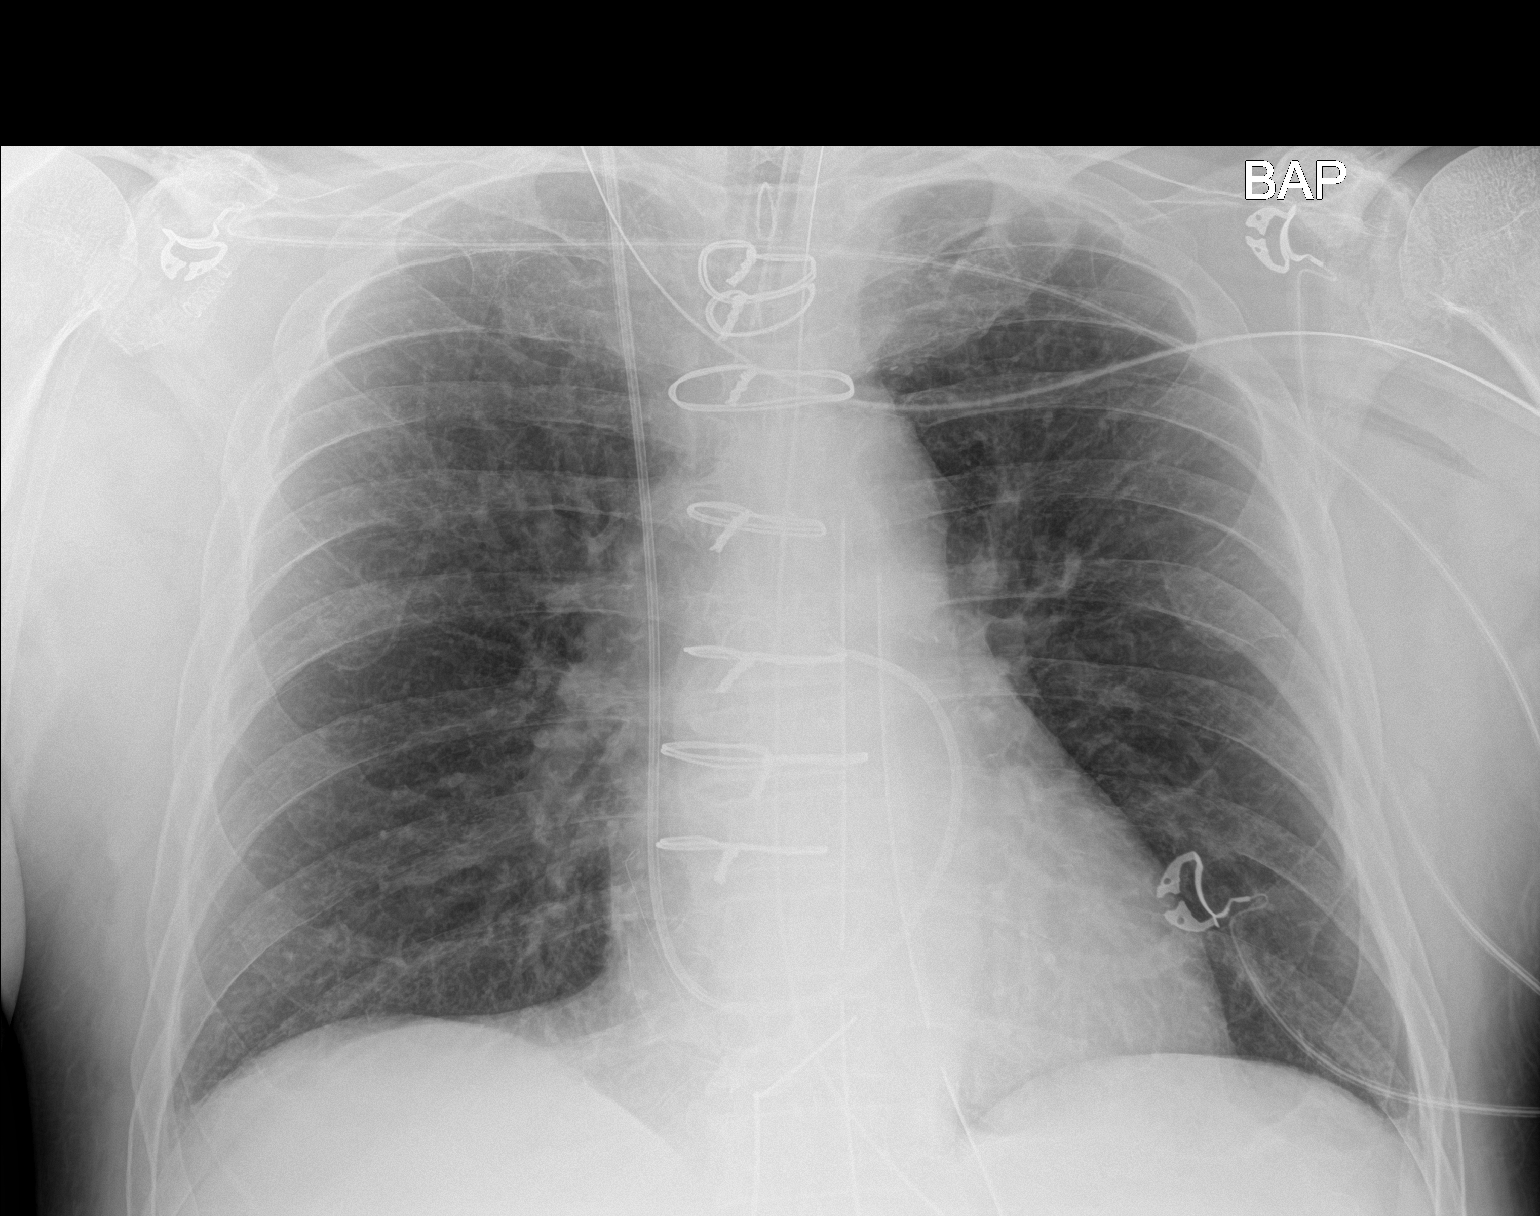

[1 of 1 positions shown; findings below may reference images not displayed]

FINDINGS: Postoperative changes in the mediastinum. Endotracheal tube with tip
measuring 6.8 cm above the carina. Enteric tube tip is not
visualized off the field of view but is below the left
hemidiaphragm. Three mediastinal drains. Right Kafle central
venous catheter with tip in the pulmonary outflow tract. Heart size
and pulmonary vascularity are normal. Lungs are clear and expanded.
No pneumothorax. No blunting of costophrenic angles.
IMPRESSION: Appliances appear in satisfactory position. No evidence of active
pulmonary disease.

## 2019-10-22 IMAGING — DX DG CHEST 1V PORT
1 series · 1 of 1 positions shown · non-contrast
Comparison: Chest radiograph from one day prior.

CLINICAL DATA: Postoperative

EXAM:
PORTABLE CHEST 1 VIEW

[chest]
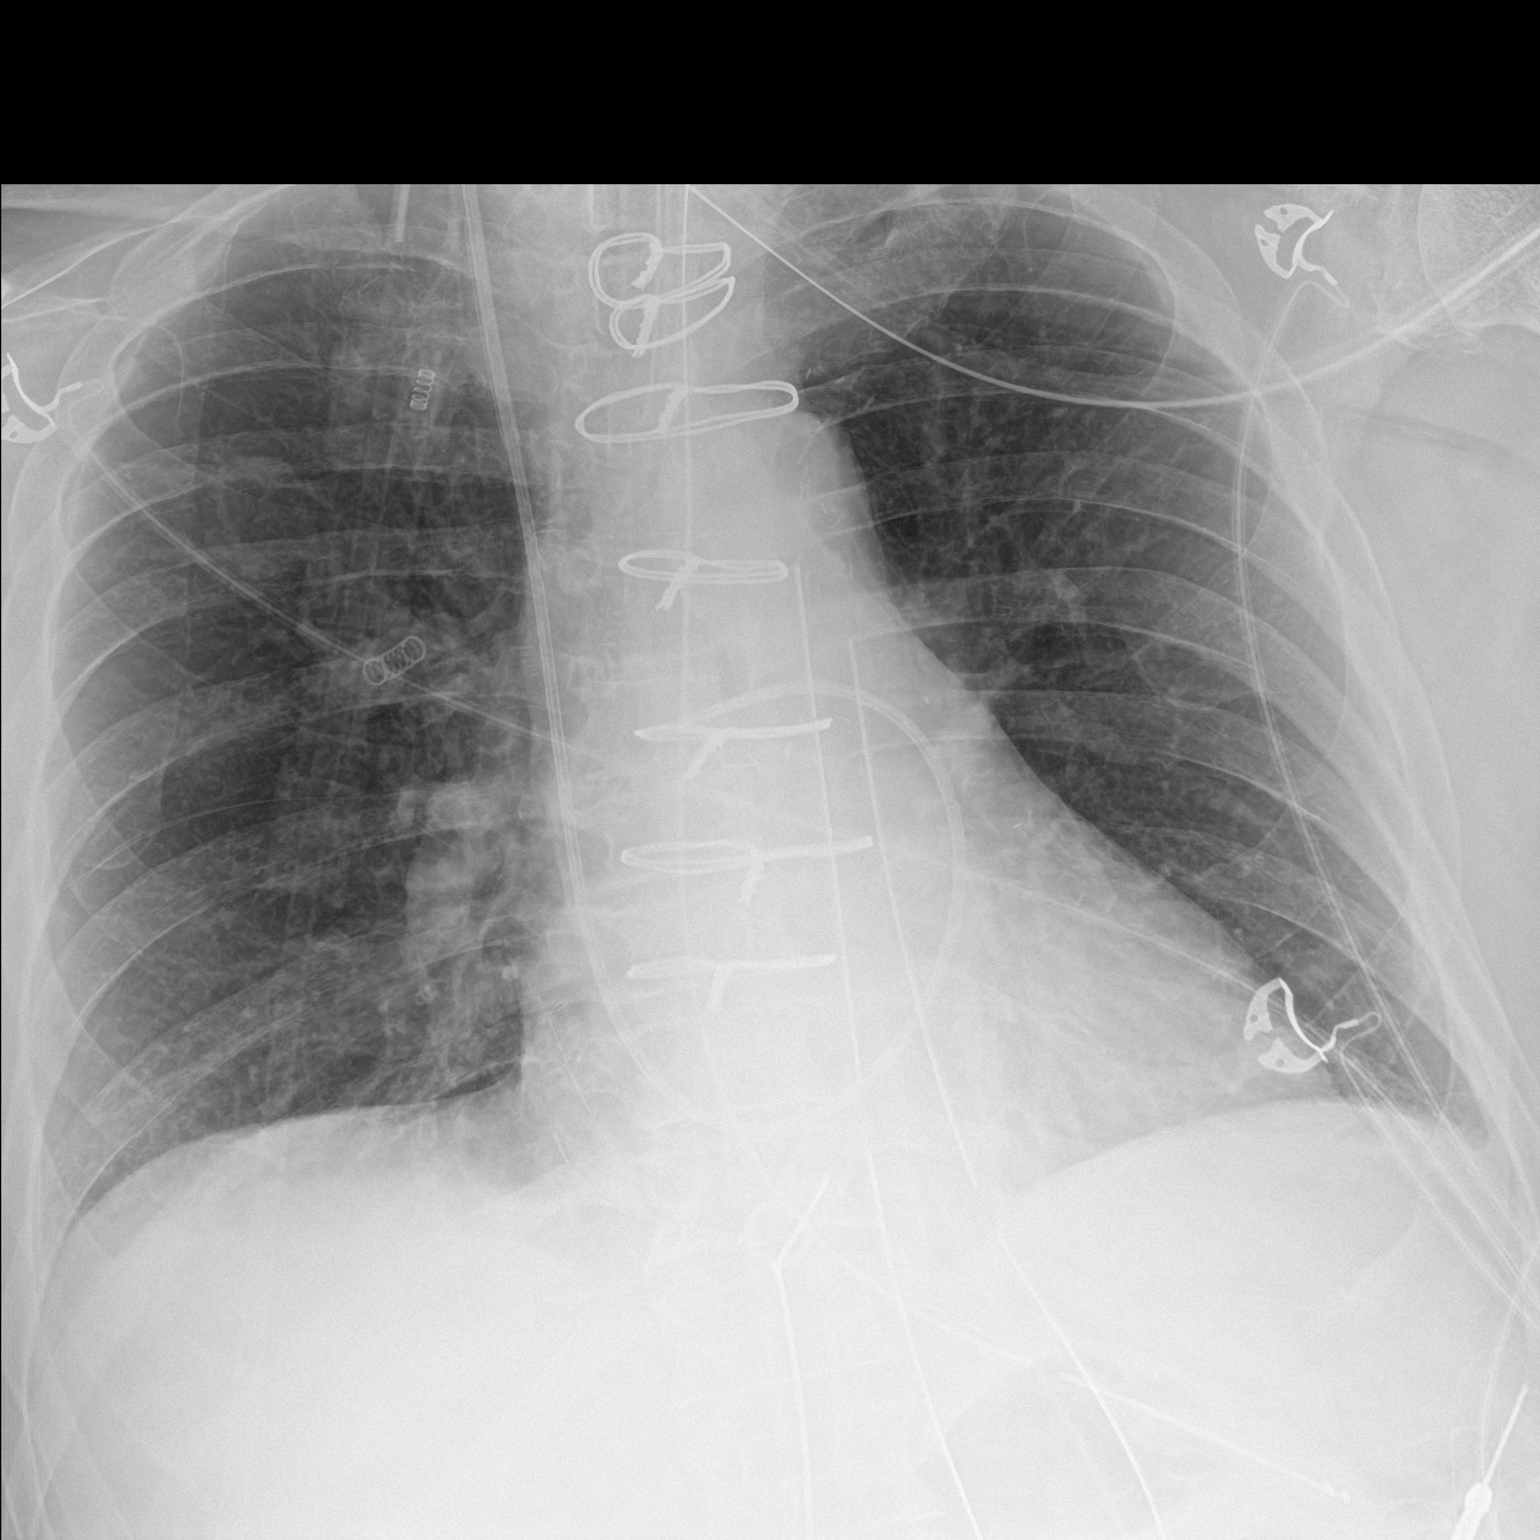

[1 of 1 positions shown; findings below may reference images not displayed]

FINDINGS: Endotracheal tube tip is 4.7 cm above the carina. Enteric tube
terminates in the proximal stomach. Right internal jugular Swan-Ganz
catheter terminates in the right pulmonary artery. Intact sternotomy
wires. Stable configuration of mediastinal drains. Stable
cardiomediastinal silhouette with normal heart size. No
pneumothorax. No pleural effusion. Stable mild bibasilar
atelectasis. No pulmonary edema.
IMPRESSION: 1. Well-positioned support structures.  No pneumothorax.
2. Stable mild bibasilar atelectasis.

## 2019-10-23 IMAGING — DX DG CHEST 1V PORT
1 series · 1 of 1 positions shown · non-contrast
Comparison: 11/24/2017, 11/23/2017, 11/18/2017

CLINICAL DATA: 61-year-old male with a history of chest tube status
post cardiac surgery

EXAM:
PORTABLE CHEST 1 VIEW

[chest]
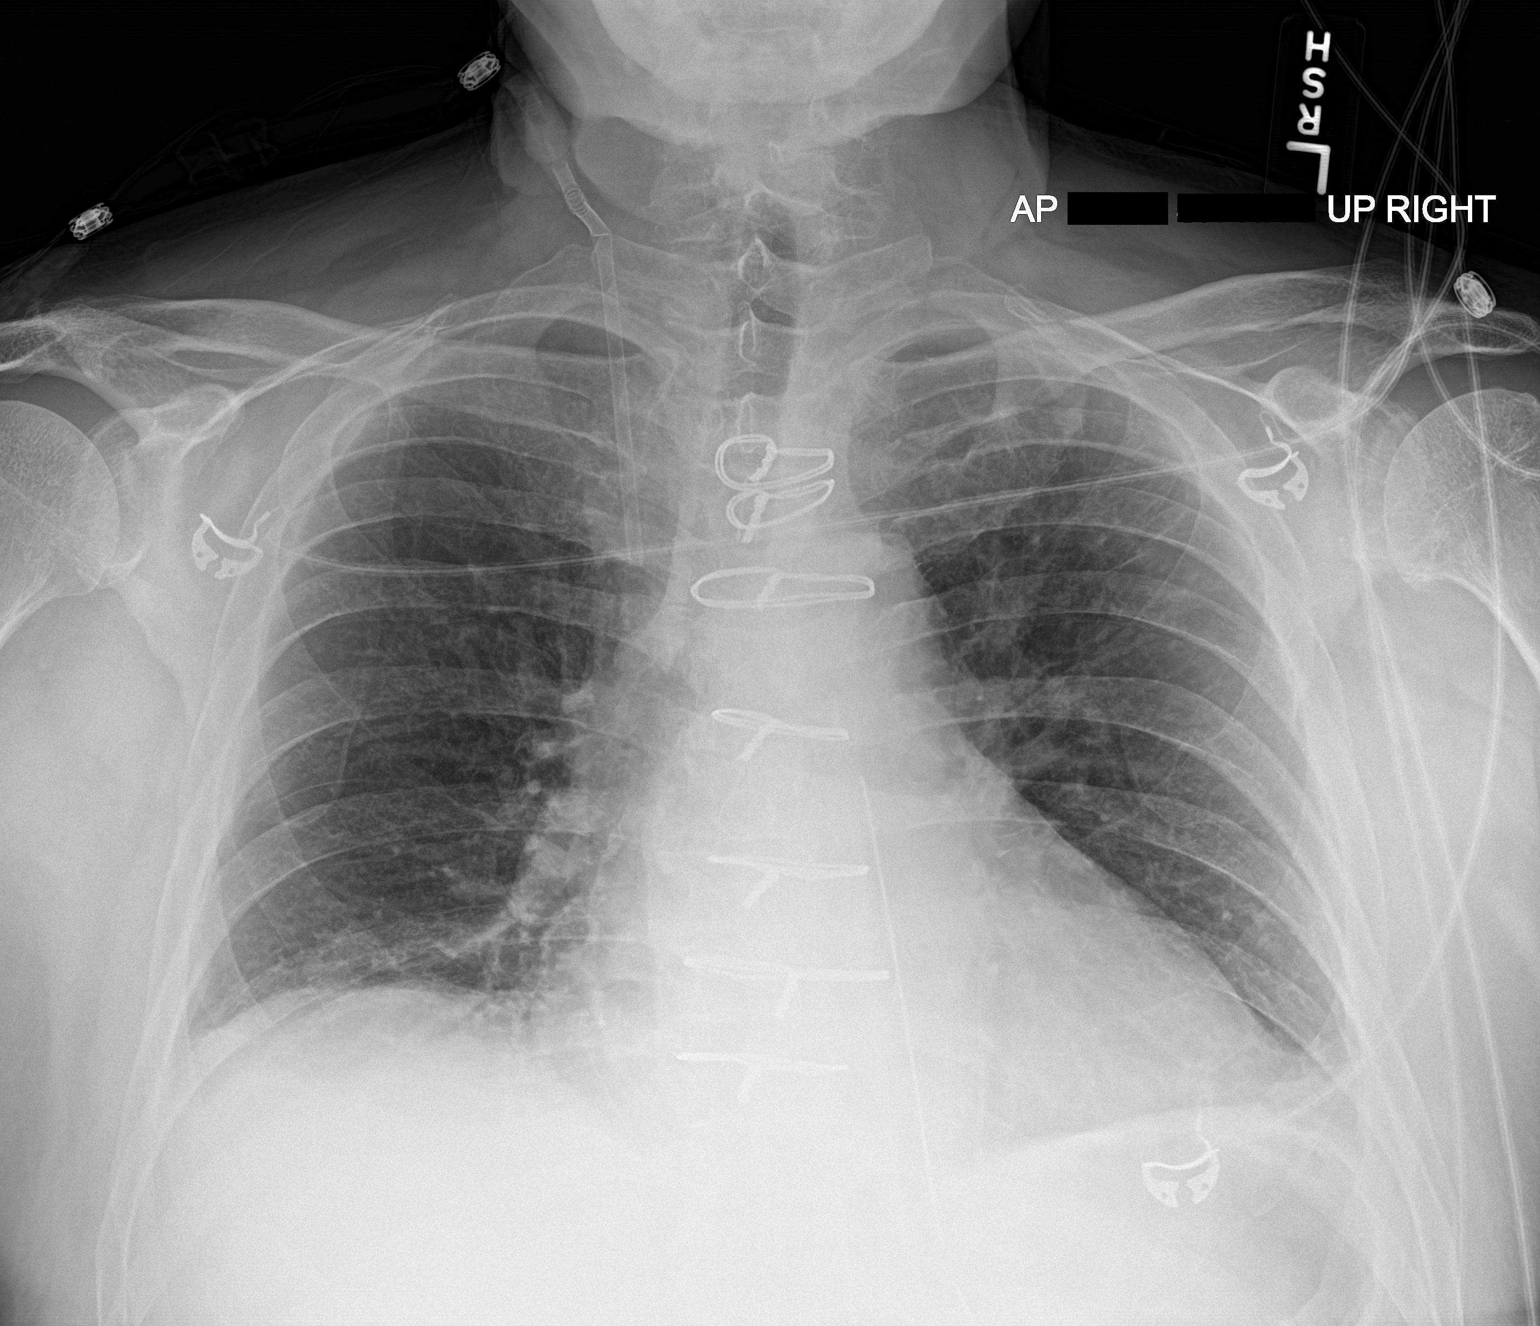

[1 of 1 positions shown; findings below may reference images not displayed]

FINDINGS: Cardiomediastinal silhouette unchanged in size and contour. Surgical
changes of median sternotomy.

Interval removal of the Swan-Ganz catheter, gastric tube,
endotracheal tube.

Right IJ sheath remains.

Interval removal of mediastinal/ pleural drains, with 1 remaining
projecting over the mediastinum.

No pneumothorax.

Low lung volumes with likely atelectasis.  No pleural effusion.
IMPRESSION: Low lung volumes with likely atelectasis, status post extubation.

Interval removal of the Swan-Ganz catheter, gastric tube, and
multiple pleural/ mediastinal drains. 1 drain remains projecting
over the mediastinum.

Right IJ sheath remains.

## 2019-10-24 IMAGING — CR DG CHEST 2V
2 series · 2 of 2 positions shown · non-contrast
Comparison: Portable chest x-ray November 25, 2017

CLINICAL DATA: Shortness of breath. History of coronary artery
disease and CABG, current smoker.

EXAM:
CHEST  2 VIEW

[chest pa]
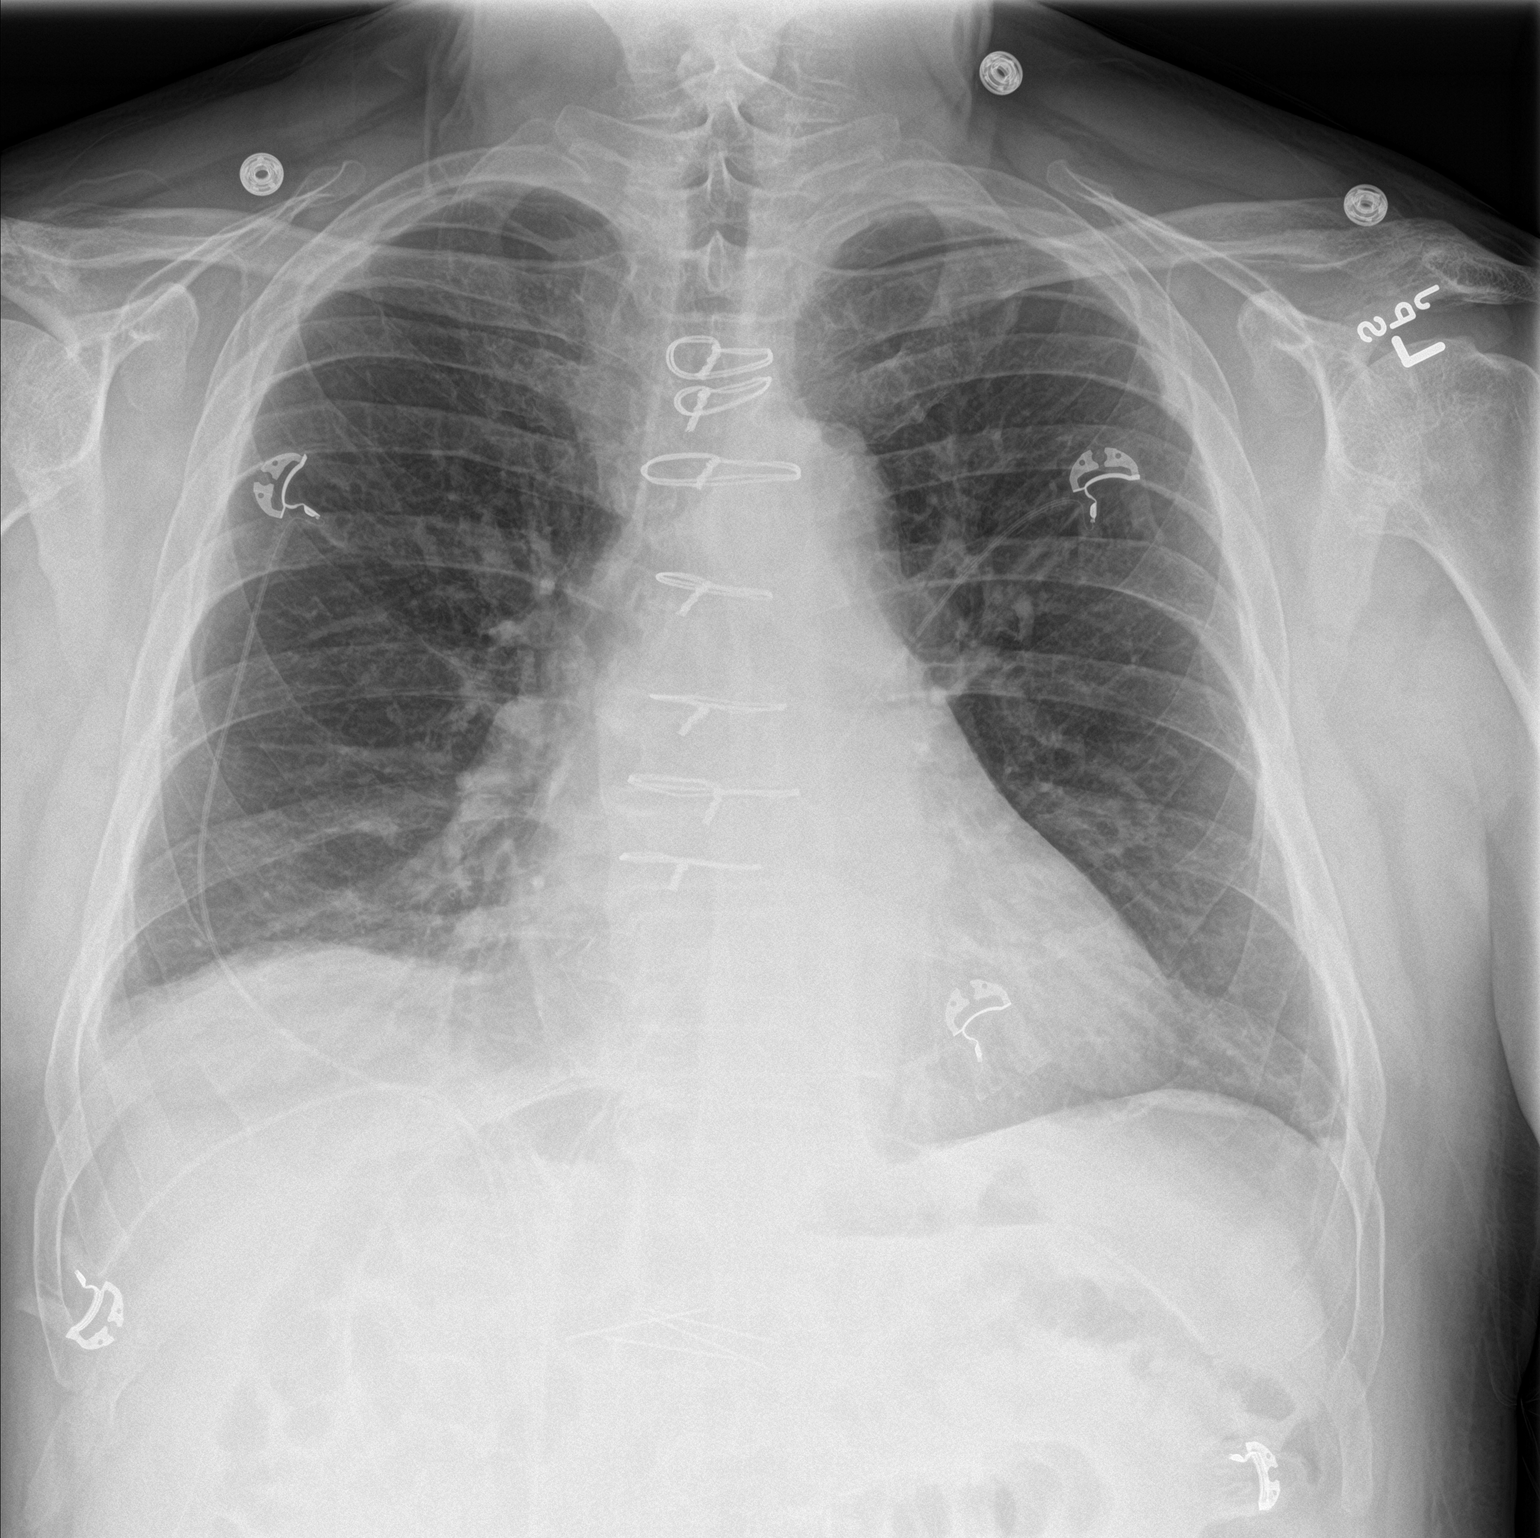

[chest lat]
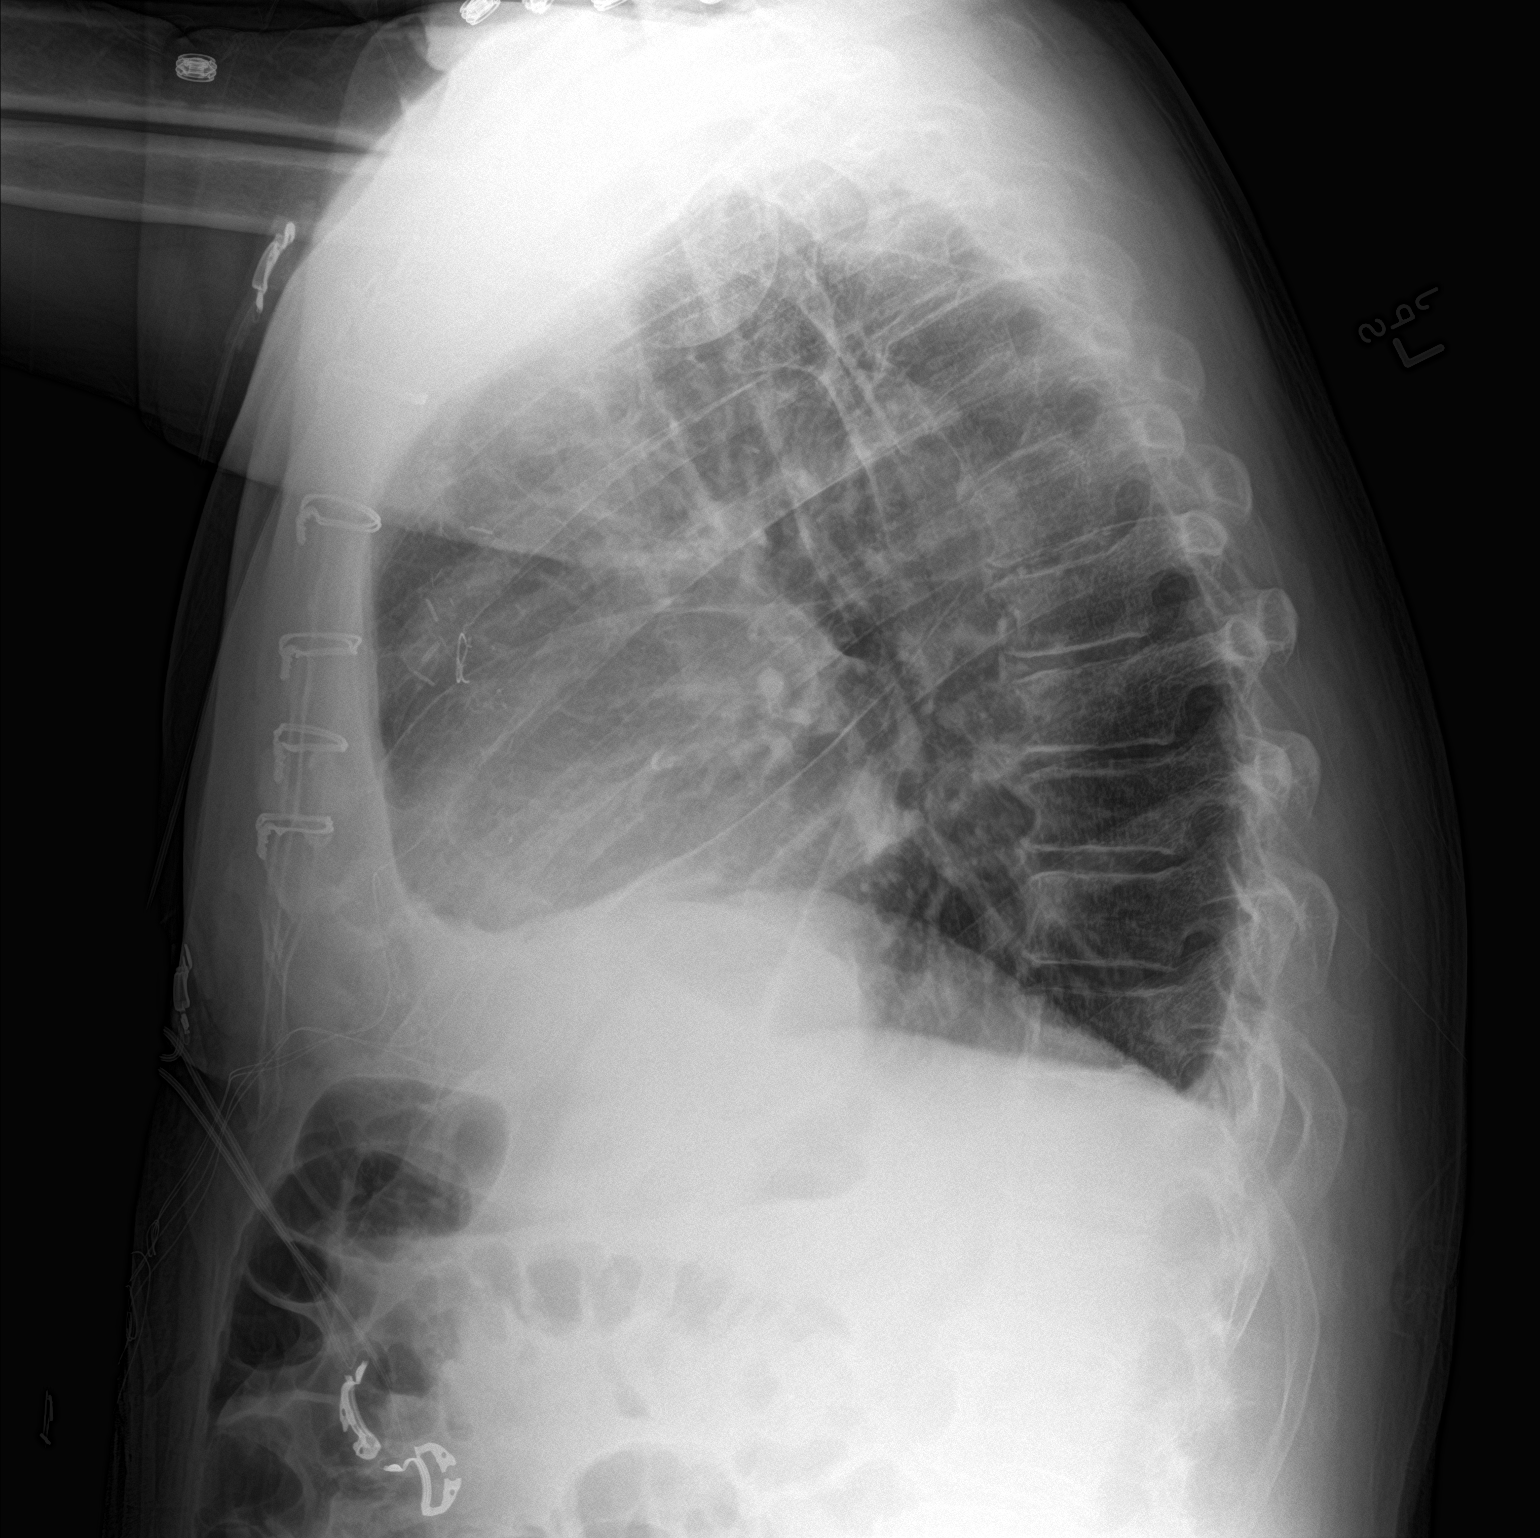

[2 of 2 positions shown; findings below may reference images not displayed]

FINDINGS: The lungs are adequately inflated. There is increased density at the
right lung base slightly more conspicuous today. There is a trace of
blunting of the left lateral costophrenic angle which is stable. The
cardiac silhouette is top-normal in size. The sternal wires are
intact. The pulmonary vascularity is normal. The bony thorax
exhibits no acute abnormality.
IMPRESSION: Increased density at the right lung base suggests subsegmental
atelectasis. There is a trace of pleural fluid bilaterally. No overt
CHF.

## 2019-12-14 DIAGNOSIS — I10 Essential (primary) hypertension: Secondary | ICD-10-CM | POA: Diagnosis not present

## 2019-12-14 DIAGNOSIS — E7800 Pure hypercholesterolemia, unspecified: Secondary | ICD-10-CM | POA: Diagnosis not present

## 2019-12-14 DIAGNOSIS — I2510 Atherosclerotic heart disease of native coronary artery without angina pectoris: Secondary | ICD-10-CM | POA: Diagnosis not present

## 2019-12-14 DIAGNOSIS — N529 Male erectile dysfunction, unspecified: Secondary | ICD-10-CM | POA: Diagnosis not present

## 2019-12-14 DIAGNOSIS — Z125 Encounter for screening for malignant neoplasm of prostate: Secondary | ICD-10-CM | POA: Diagnosis not present

## 2019-12-22 DIAGNOSIS — Z1212 Encounter for screening for malignant neoplasm of rectum: Secondary | ICD-10-CM | POA: Diagnosis not present

## 2019-12-22 DIAGNOSIS — Z1211 Encounter for screening for malignant neoplasm of colon: Secondary | ICD-10-CM | POA: Diagnosis not present

## 2019-12-27 LAB — EXTERNAL GENERIC LAB PROCEDURE: COLOGUARD: NEGATIVE

## 2019-12-29 DIAGNOSIS — Z20822 Contact with and (suspected) exposure to covid-19: Secondary | ICD-10-CM | POA: Diagnosis not present

## 2019-12-29 DIAGNOSIS — I2510 Atherosclerotic heart disease of native coronary artery without angina pectoris: Secondary | ICD-10-CM | POA: Diagnosis not present

## 2020-01-16 DIAGNOSIS — R972 Elevated prostate specific antigen [PSA]: Secondary | ICD-10-CM | POA: Diagnosis not present

## 2020-02-01 DIAGNOSIS — N401 Enlarged prostate with lower urinary tract symptoms: Secondary | ICD-10-CM | POA: Diagnosis not present

## 2020-02-01 DIAGNOSIS — R972 Elevated prostate specific antigen [PSA]: Secondary | ICD-10-CM | POA: Diagnosis not present

## 2020-03-12 DIAGNOSIS — N401 Enlarged prostate with lower urinary tract symptoms: Secondary | ICD-10-CM | POA: Diagnosis not present

## 2020-03-12 DIAGNOSIS — R972 Elevated prostate specific antigen [PSA]: Secondary | ICD-10-CM | POA: Diagnosis not present

## 2020-06-12 DIAGNOSIS — I10 Essential (primary) hypertension: Secondary | ICD-10-CM | POA: Diagnosis not present

## 2020-06-12 DIAGNOSIS — Z1331 Encounter for screening for depression: Secondary | ICD-10-CM | POA: Diagnosis not present

## 2020-06-12 DIAGNOSIS — N529 Male erectile dysfunction, unspecified: Secondary | ICD-10-CM | POA: Diagnosis not present

## 2020-06-12 DIAGNOSIS — I2510 Atherosclerotic heart disease of native coronary artery without angina pectoris: Secondary | ICD-10-CM | POA: Diagnosis not present

## 2020-06-12 DIAGNOSIS — E7800 Pure hypercholesterolemia, unspecified: Secondary | ICD-10-CM | POA: Diagnosis not present

## 2020-07-13 DIAGNOSIS — N401 Enlarged prostate with lower urinary tract symptoms: Secondary | ICD-10-CM | POA: Diagnosis not present

## 2020-07-13 DIAGNOSIS — R972 Elevated prostate specific antigen [PSA]: Secondary | ICD-10-CM | POA: Diagnosis not present

## 2020-08-13 DIAGNOSIS — R972 Elevated prostate specific antigen [PSA]: Secondary | ICD-10-CM | POA: Diagnosis not present

## 2020-08-14 DIAGNOSIS — R972 Elevated prostate specific antigen [PSA]: Secondary | ICD-10-CM | POA: Diagnosis not present

## 2020-08-14 DIAGNOSIS — N401 Enlarged prostate with lower urinary tract symptoms: Secondary | ICD-10-CM | POA: Diagnosis not present

## 2020-08-21 DIAGNOSIS — N401 Enlarged prostate with lower urinary tract symptoms: Secondary | ICD-10-CM | POA: Diagnosis not present

## 2020-08-21 DIAGNOSIS — R972 Elevated prostate specific antigen [PSA]: Secondary | ICD-10-CM | POA: Diagnosis not present

## 2020-12-18 DIAGNOSIS — E7800 Pure hypercholesterolemia, unspecified: Secondary | ICD-10-CM | POA: Diagnosis not present

## 2020-12-18 DIAGNOSIS — I10 Essential (primary) hypertension: Secondary | ICD-10-CM | POA: Diagnosis not present

## 2020-12-18 DIAGNOSIS — I2510 Atherosclerotic heart disease of native coronary artery without angina pectoris: Secondary | ICD-10-CM | POA: Diagnosis not present

## 2020-12-18 DIAGNOSIS — N529 Male erectile dysfunction, unspecified: Secondary | ICD-10-CM | POA: Diagnosis not present

## 2021-06-18 DIAGNOSIS — E7800 Pure hypercholesterolemia, unspecified: Secondary | ICD-10-CM | POA: Diagnosis not present

## 2021-06-18 DIAGNOSIS — N529 Male erectile dysfunction, unspecified: Secondary | ICD-10-CM | POA: Diagnosis not present

## 2021-06-18 DIAGNOSIS — I10 Essential (primary) hypertension: Secondary | ICD-10-CM | POA: Diagnosis not present

## 2021-06-18 DIAGNOSIS — I2510 Atherosclerotic heart disease of native coronary artery without angina pectoris: Secondary | ICD-10-CM | POA: Diagnosis not present

## 2021-12-24 DIAGNOSIS — J40 Bronchitis, not specified as acute or chronic: Secondary | ICD-10-CM | POA: Diagnosis not present

## 2021-12-24 DIAGNOSIS — F17210 Nicotine dependence, cigarettes, uncomplicated: Secondary | ICD-10-CM | POA: Diagnosis not present

## 2021-12-24 DIAGNOSIS — Z6826 Body mass index (BMI) 26.0-26.9, adult: Secondary | ICD-10-CM | POA: Diagnosis not present

## 2022-01-09 DIAGNOSIS — I10 Essential (primary) hypertension: Secondary | ICD-10-CM | POA: Diagnosis not present

## 2022-01-09 DIAGNOSIS — Z87891 Personal history of nicotine dependence: Secondary | ICD-10-CM | POA: Diagnosis not present

## 2022-01-09 DIAGNOSIS — I2510 Atherosclerotic heart disease of native coronary artery without angina pectoris: Secondary | ICD-10-CM | POA: Diagnosis not present

## 2022-01-09 DIAGNOSIS — E7800 Pure hypercholesterolemia, unspecified: Secondary | ICD-10-CM | POA: Diagnosis not present

## 2022-01-09 DIAGNOSIS — Z125 Encounter for screening for malignant neoplasm of prostate: Secondary | ICD-10-CM | POA: Diagnosis not present

## 2023-01-19 DIAGNOSIS — I7789 Other specified disorders of arteries and arterioles: Secondary | ICD-10-CM | POA: Diagnosis not present

## 2023-01-19 DIAGNOSIS — I2510 Atherosclerotic heart disease of native coronary artery without angina pectoris: Secondary | ICD-10-CM | POA: Diagnosis not present

## 2023-01-19 DIAGNOSIS — I10 Essential (primary) hypertension: Secondary | ICD-10-CM | POA: Diagnosis not present

## 2023-01-19 DIAGNOSIS — E7800 Pure hypercholesterolemia, unspecified: Secondary | ICD-10-CM | POA: Diagnosis not present

## 2023-01-19 DIAGNOSIS — Z1211 Encounter for screening for malignant neoplasm of colon: Secondary | ICD-10-CM | POA: Diagnosis not present

## 2023-01-31 DIAGNOSIS — Z1211 Encounter for screening for malignant neoplasm of colon: Secondary | ICD-10-CM | POA: Diagnosis not present

## 2023-01-31 DIAGNOSIS — Z1212 Encounter for screening for malignant neoplasm of rectum: Secondary | ICD-10-CM | POA: Diagnosis not present

## 2023-02-08 LAB — COLOGUARD: COLOGUARD: NEGATIVE

## 2023-02-08 LAB — EXTERNAL GENERIC LAB PROCEDURE: COLOGUARD: NEGATIVE

## 2023-03-18 DIAGNOSIS — Z87891 Personal history of nicotine dependence: Secondary | ICD-10-CM | POA: Diagnosis not present

## 2023-07-20 DIAGNOSIS — I7789 Other specified disorders of arteries and arterioles: Secondary | ICD-10-CM | POA: Diagnosis not present

## 2023-07-20 DIAGNOSIS — Z139 Encounter for screening, unspecified: Secondary | ICD-10-CM | POA: Diagnosis not present

## 2023-07-20 DIAGNOSIS — E7800 Pure hypercholesterolemia, unspecified: Secondary | ICD-10-CM | POA: Diagnosis not present

## 2023-07-20 DIAGNOSIS — Z9181 History of falling: Secondary | ICD-10-CM | POA: Diagnosis not present

## 2023-07-20 DIAGNOSIS — Z1211 Encounter for screening for malignant neoplasm of colon: Secondary | ICD-10-CM | POA: Diagnosis not present

## 2023-07-20 DIAGNOSIS — I2510 Atherosclerotic heart disease of native coronary artery without angina pectoris: Secondary | ICD-10-CM | POA: Diagnosis not present

## 2023-07-20 DIAGNOSIS — M25512 Pain in left shoulder: Secondary | ICD-10-CM | POA: Diagnosis not present

## 2023-07-20 DIAGNOSIS — I10 Essential (primary) hypertension: Secondary | ICD-10-CM | POA: Diagnosis not present

## 2023-07-20 DIAGNOSIS — Z87891 Personal history of nicotine dependence: Secondary | ICD-10-CM | POA: Diagnosis not present

## 2023-07-28 DIAGNOSIS — M5090 Cervical disc disorder, unspecified, unspecified cervical region: Secondary | ICD-10-CM | POA: Diagnosis not present

## 2023-07-28 DIAGNOSIS — M25512 Pain in left shoulder: Secondary | ICD-10-CM | POA: Diagnosis not present

## 2023-07-28 DIAGNOSIS — G8929 Other chronic pain: Secondary | ICD-10-CM | POA: Diagnosis not present

## 2023-08-03 DIAGNOSIS — F17210 Nicotine dependence, cigarettes, uncomplicated: Secondary | ICD-10-CM | POA: Diagnosis not present

## 2023-08-03 DIAGNOSIS — I10 Essential (primary) hypertension: Secondary | ICD-10-CM | POA: Diagnosis not present

## 2023-08-03 DIAGNOSIS — I498 Other specified cardiac arrhythmias: Secondary | ICD-10-CM | POA: Diagnosis not present

## 2023-08-03 DIAGNOSIS — R079 Chest pain, unspecified: Secondary | ICD-10-CM | POA: Diagnosis not present

## 2023-08-03 DIAGNOSIS — E860 Dehydration: Secondary | ICD-10-CM | POA: Diagnosis not present

## 2023-08-03 DIAGNOSIS — I2510 Atherosclerotic heart disease of native coronary artery without angina pectoris: Secondary | ICD-10-CM | POA: Diagnosis not present

## 2023-08-03 DIAGNOSIS — Z951 Presence of aortocoronary bypass graft: Secondary | ICD-10-CM | POA: Diagnosis not present

## 2023-08-03 DIAGNOSIS — E7800 Pure hypercholesterolemia, unspecified: Secondary | ICD-10-CM | POA: Diagnosis not present

## 2023-08-03 DIAGNOSIS — N179 Acute kidney failure, unspecified: Secondary | ICD-10-CM | POA: Diagnosis not present

## 2023-08-10 DIAGNOSIS — I10 Essential (primary) hypertension: Secondary | ICD-10-CM | POA: Diagnosis not present

## 2023-08-10 DIAGNOSIS — I959 Hypotension, unspecified: Secondary | ICD-10-CM | POA: Diagnosis not present

## 2023-08-10 DIAGNOSIS — I2510 Atherosclerotic heart disease of native coronary artery without angina pectoris: Secondary | ICD-10-CM | POA: Diagnosis not present

## 2023-08-10 DIAGNOSIS — N179 Acute kidney failure, unspecified: Secondary | ICD-10-CM | POA: Diagnosis not present

## 2023-08-11 DIAGNOSIS — D649 Anemia, unspecified: Secondary | ICD-10-CM | POA: Diagnosis not present

## 2023-08-11 DIAGNOSIS — I77810 Thoracic aortic ectasia: Secondary | ICD-10-CM | POA: Diagnosis not present

## 2023-08-11 DIAGNOSIS — F17210 Nicotine dependence, cigarettes, uncomplicated: Secondary | ICD-10-CM | POA: Diagnosis not present

## 2023-08-11 DIAGNOSIS — Z122 Encounter for screening for malignant neoplasm of respiratory organs: Secondary | ICD-10-CM | POA: Diagnosis not present

## 2023-08-11 DIAGNOSIS — R911 Solitary pulmonary nodule: Secondary | ICD-10-CM | POA: Diagnosis not present

## 2023-08-13 DIAGNOSIS — E860 Dehydration: Secondary | ICD-10-CM | POA: Diagnosis not present

## 2023-08-17 DIAGNOSIS — D649 Anemia, unspecified: Secondary | ICD-10-CM | POA: Diagnosis not present

## 2023-08-17 DIAGNOSIS — Z1212 Encounter for screening for malignant neoplasm of rectum: Secondary | ICD-10-CM | POA: Diagnosis not present

## 2023-08-24 DIAGNOSIS — D649 Anemia, unspecified: Secondary | ICD-10-CM | POA: Diagnosis not present

## 2023-08-24 DIAGNOSIS — I10 Essential (primary) hypertension: Secondary | ICD-10-CM | POA: Diagnosis not present

## 2023-08-24 DIAGNOSIS — I2510 Atherosclerotic heart disease of native coronary artery without angina pectoris: Secondary | ICD-10-CM | POA: Diagnosis not present

## 2023-11-02 DIAGNOSIS — H40003 Preglaucoma, unspecified, bilateral: Secondary | ICD-10-CM | POA: Diagnosis not present

## 2023-11-02 DIAGNOSIS — H2513 Age-related nuclear cataract, bilateral: Secondary | ICD-10-CM | POA: Diagnosis not present

## 2024-01-26 DIAGNOSIS — H9193 Unspecified hearing loss, bilateral: Secondary | ICD-10-CM | POA: Diagnosis not present

## 2024-01-26 DIAGNOSIS — I2510 Atherosclerotic heart disease of native coronary artery without angina pectoris: Secondary | ICD-10-CM | POA: Diagnosis not present

## 2024-01-26 DIAGNOSIS — I10 Essential (primary) hypertension: Secondary | ICD-10-CM | POA: Diagnosis not present

## 2024-01-26 DIAGNOSIS — I7789 Other specified disorders of arteries and arterioles: Secondary | ICD-10-CM | POA: Diagnosis not present

## 2024-01-26 DIAGNOSIS — M7741 Metatarsalgia, right foot: Secondary | ICD-10-CM | POA: Diagnosis not present

## 2024-01-26 DIAGNOSIS — E7800 Pure hypercholesterolemia, unspecified: Secondary | ICD-10-CM | POA: Diagnosis not present

## 2024-03-17 DIAGNOSIS — M79671 Pain in right foot: Secondary | ICD-10-CM | POA: Diagnosis not present

## 2024-03-31 DIAGNOSIS — H903 Sensorineural hearing loss, bilateral: Secondary | ICD-10-CM | POA: Diagnosis not present

## 2024-03-31 DIAGNOSIS — H9313 Tinnitus, bilateral: Secondary | ICD-10-CM | POA: Diagnosis not present

## 2024-03-31 DIAGNOSIS — Z570 Occupational exposure to noise: Secondary | ICD-10-CM | POA: Diagnosis not present

## 2024-04-14 ENCOUNTER — Ambulatory Visit: Payer: Self-pay | Admitting: Podiatry

## 2024-04-14 DIAGNOSIS — M21621 Bunionette of right foot: Secondary | ICD-10-CM

## 2024-04-14 NOTE — Progress Notes (Signed)
 Subjective:  Patient ID: Dale [name], male    DOB: April 30, 1956,  MRN: [id number]  Chief Complaint  Patient presents with   Callouses    Callus     68 y.o. male presents with the above complaint.  Patient presents with right submetatarsal 5 hyperkeratotic lesion/tailor's bunion.  Patient states started to cause him a lot of discomfort.  He wore some new fitting shoes that may have led to this.  Since then he changed his shoes and started to feel better.  Just wanted to make sure there is nothing else going on denies any other acute, pain scale is 3 out of 10 dull aching nature   Review of Systems: Negative except as noted in the HPI. Denies N/V/F/Ch.  Past Medical History:  Diagnosis Date   HTN (hypertension)    Tobacco use     Current Outpatient Medications:    acetaminophen  (TYLENOL ) 325 MG tablet, Take 2 tablets (650 mg total) by mouth every 6 (six) hours as needed for mild pain., Disp: , Rfl:    amLODipine  (NORVASC ) 10 MG tablet, Take 1 tablet (10 mg total) by mouth daily., Disp: 30 tablet, Rfl: 5   aspirin  EC 81 MG tablet, Take 81 mg by mouth daily., Disp: , Rfl:    atorvastatin  (LIPITOR ) 80 MG tablet, Take 1 tablet (80 mg total) by mouth daily., Disp: 90 tablet, Rfl: 3   lisinopril  (PRINIVIL ,ZESTRIL ) 10 MG tablet, Take 1 tablet (10 mg total) by mouth daily., Disp: 90 tablet, Rfl: 3   metoprolol  tartrate (LOPRESSOR ) 25 MG tablet, Take 0.5 tablets (12.5 mg total) by mouth 2 (two) times daily., Disp: 90 tablet, Rfl: 1   nicotine  (NICODERM CQ  - DOSED IN MG/24 HR) 7 mg/24hr patch, Place 1 patch (7 mg total) onto the skin daily., Disp: 28 patch, Rfl: 0   traMADol  (ULTRAM ) 50 MG tablet, Take 1 tablet (50 mg total) by mouth every 6 (six) hours as needed., Disp: 40 tablet, Rfl: 0  Social History   Tobacco Use  Smoking Status Every Day   Types: Cigarettes  Smokeless Tobacco Never    Allergies  Allergen Reactions   Chlorhexidine      rash   Chantix [Varenicline] Other (See  Comments)    Nightmares   Objective:  There were no vitals filed for this visit. There is no height or weight on file to calculate BMI. Constitutional Well developed. Well nourished.  Vascular Dorsalis pedis pulses palpable bilaterally. Posterior tibial pulses palpable bilaterally. Capillary refill normal to all digits.  No cyanosis or clubbing noted. Pedal hair growth normal.  Neurologic Normal speech. Oriented to person, place, and time. Epicritic sensation to light touch grossly present bilaterally.  Dermatologic Nails well groomed and normal in appearance. No open wounds. No skin lesions.  Orthopedic: Right submetatarsal 5 porokeratotic lesion with tailor's bunion.  Moderate tailor's bunion noted nonreducible.  No intra-articular first MPJ pain noted.   Radiographs: None Assessment:   1. Tailor's bunionette, right    Plan:  Patient was evaluated and treated and all questions answered.  Right tailor's bunion symptoms improving - All questions and concerns were discussed with the patient in extensive detail - I extensively discussed shoe gear modification with extrawide toebox.  I discussed with him that he is experiencing moderate tailor's bunion deformity which is common as it progresses to the right.  If it continues to bother him after shoe gear modification he will come back and see me we will discuss surgical options..  No follow-ups on  file.

## 2024-05-17 DIAGNOSIS — J439 Emphysema, unspecified: Secondary | ICD-10-CM | POA: Diagnosis not present

## 2024-05-17 DIAGNOSIS — J441 Chronic obstructive pulmonary disease with (acute) exacerbation: Secondary | ICD-10-CM | POA: Diagnosis not present

## 2024-05-17 DIAGNOSIS — R6889 Other general symptoms and signs: Secondary | ICD-10-CM | POA: Diagnosis not present

## 2024-07-06 DIAGNOSIS — L03031 Cellulitis of right toe: Secondary | ICD-10-CM | POA: Diagnosis not present

## 2024-07-11 DIAGNOSIS — J439 Emphysema, unspecified: Secondary | ICD-10-CM | POA: Diagnosis not present

## 2024-07-11 DIAGNOSIS — Z87891 Personal history of nicotine dependence: Secondary | ICD-10-CM | POA: Diagnosis not present

## 2024-08-03 DIAGNOSIS — Z139 Encounter for screening, unspecified: Secondary | ICD-10-CM | POA: Diagnosis not present

## 2024-08-03 DIAGNOSIS — I2510 Atherosclerotic heart disease of native coronary artery without angina pectoris: Secondary | ICD-10-CM | POA: Diagnosis not present

## 2024-08-03 DIAGNOSIS — E7800 Pure hypercholesterolemia, unspecified: Secondary | ICD-10-CM | POA: Diagnosis not present

## 2024-08-03 DIAGNOSIS — I7789 Other specified disorders of arteries and arterioles: Secondary | ICD-10-CM | POA: Diagnosis not present

## 2024-08-03 DIAGNOSIS — Z9181 History of falling: Secondary | ICD-10-CM | POA: Diagnosis not present

## 2024-08-03 DIAGNOSIS — I10 Essential (primary) hypertension: Secondary | ICD-10-CM | POA: Diagnosis not present

## 2024-08-17 DIAGNOSIS — F17210 Nicotine dependence, cigarettes, uncomplicated: Secondary | ICD-10-CM | POA: Diagnosis not present

## 2024-08-17 DIAGNOSIS — Z87891 Personal history of nicotine dependence: Secondary | ICD-10-CM | POA: Diagnosis not present

## 2024-08-17 DIAGNOSIS — Z122 Encounter for screening for malignant neoplasm of respiratory organs: Secondary | ICD-10-CM | POA: Diagnosis not present
